# Patient Record
Sex: Female | Born: 1995 | Race: Black or African American | Hispanic: No | Marital: Single | Smoking: Former smoker
Health system: Southern US, Community
[De-identification: ages and names within clinical notes are randomized; demographics above are authoritative.]

## PROBLEM LIST (undated history)

## (undated) DIAGNOSIS — F419 Anxiety disorder, unspecified: Secondary | ICD-10-CM

## (undated) HISTORY — DX: Anxiety disorder, unspecified: F41.9

---
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
## 2017-05-07 ENCOUNTER — Emergency Department (HOSPITAL_COMMUNITY)
Admission: EM | Admit: 2017-05-07 | Discharge: 2017-05-07 | Disposition: A | Discharge status: Home / Self Care | Payer: Self-pay | Attending: Emergency Medicine | Admitting: Emergency Medicine

## 2017-05-07 ENCOUNTER — Encounter (HOSPITAL_COMMUNITY): Payer: Self-pay | Admitting: Pharmacy Technician

## 2017-05-07 ENCOUNTER — Other Ambulatory Visit: Payer: Self-pay

## 2017-05-07 DIAGNOSIS — J111 Influenza due to unidentified influenza virus with other respiratory manifestations: Secondary | ICD-10-CM | POA: Insufficient documentation

## 2017-05-07 DIAGNOSIS — R6889 Other general symptoms and signs: Secondary | ICD-10-CM

## 2017-05-07 DIAGNOSIS — F17210 Nicotine dependence, cigarettes, uncomplicated: Secondary | ICD-10-CM | POA: Insufficient documentation

## 2017-05-07 LAB — RAPID STREP SCREEN (MED CTR MEBANE ONLY): STREPTOCOCCUS, GROUP A SCREEN (DIRECT): NEGATIVE

## 2017-05-07 MED ORDER — ACETAMINOPHEN 500 MG PO TABS
1000.0000 mg | ORAL_TABLET | Freq: Once | ORAL | Status: AC
  Administered 2017-05-07: 1000 mg via ORAL
  Filled 2017-05-07: qty 2

## 2017-05-07 MED ORDER — GUAIFENESIN-CODEINE 100-10 MG/5ML PO SYRP: 5.0000 mL | ORAL_SOLUTION | Freq: Three times a day (TID) | ORAL | 0 refills | Status: DC | PRN

## 2017-05-07 NOTE — Discharge Instructions (Signed)
You do not have strep throat. Your symptoms are consistent with the flu.   I have written you a prescription for cough syrup. It has codeine in it so it may make you drowsy. Please do not drive, work or drink alcohol while taking it.   Drink plenty of fluids and get rest over the next few days.   Take ibuprofen as needed for body aches and pains and tylenol for fever.   I have also listed the information to [facility] below. They are located across the street and accept patients who do not have insurance.   Return to the ER if you have cough with trouble breathing, vomiting that will not stop or your symptoms worsen or become concerning in any way.

## 2017-05-07 NOTE — ED Provider Notes (Signed)
[facility] EMER[facility]ENCY [facility] Provider Note   CSN: [id number] Arrival date & time: 05/07/17  0947     History   Chief Complaint Chief Complaint  Patient presents with  . Influenza    HPI Victoria Bryan is a 22 y.o. female.  HPI   Victoria Bryan is a 22yo female with no significant past medical history who presents to the emergency department for evaluation of cough, sore throat, headache, congestion and body aches.  Patient states her symptoms developed 2 days ago.  Her cough is productive of a white phlegm.  Denies associated shortness of breath or chest pain.  Sore throat is 6/10 in severity and described as "scratchy." Worsened with coughing.  States that she has had chills at home, but has not measured her temperature.  Reports that she has been taking over-the-counter Mucinex and ibuprofen for her symptoms with mild relief.  States that she has a sister at home with similar symptoms and also several coworkers who are sick with similar.  She did not get her flu shot this year.  States that she is otherwise been able to eat and drink normally. Denies dysphagia, abdominal pain, n/v, diarrhea, dysuria, ear pain.   History reviewed. No pertinent past medical history.  There are no active problems to display for this patient.   History reviewed. No pertinent surgical history.  OB History    No data available       Home Medications    Prior to Admission medications   Medication Sig Start Date End Date Taking? Authorizing Provider  guaiFENesin (MUCINEX) 600 MG 12 hr tablet Take 1,200 mg by mouth 2 (two) times daily.   Yes [provider]  ibuprofen (ADVIL,MOTRIN) 200 MG tablet Take 400 mg by mouth every 6 (six) hours as needed for fever or mild pain.   Yes [provider]    Family History No family history on file.  Social History Social History   Tobacco Use  . Smoking status: Current Every Day Smoker    Packs/day: 0.25   Types: Cigarettes  Substance Use Topics  . Alcohol use: No  . Drug use: No     Allergies   Patient has no known allergies.   Review of Systems Review of Systems  Constitutional: Positive for chills and fatigue. Negative for fever.  HENT: Positive for congestion, rhinorrhea and sore throat. Negative for ear pain and trouble swallowing.   Respiratory: Positive for cough. Negative for shortness of breath and wheezing.   Cardiovascular: Negative for chest pain.  Gastrointestinal: Negative for abdominal pain, diarrhea, nausea and vomiting.  Musculoskeletal: Positive for myalgias (total body).  Neurological: Positive for headaches.     Physical Exam Updated Vital Signs BP 115/78 (BP Location: Right Arm)   Pulse (!) 105   Temp 99.1 F (37.3 C) (Oral)   Resp 18   Ht 5\' 4"  (1.626 m)   Wt 62.1 kg (137 lb)   LMP 04/14/2017   SpO2 97%   BMI 23.52 kg/m   Physical Exam  Constitutional: She is oriented to person, place, and time. She appears well-developed and well-nourished. No distress.  HENT:  Head: Normocephalic and atraumatic.  Clear rhinorrhea in bilateral nares. Posterior oropharynx erythematous.  No tonsillar swelling or exudate. Mucous membranes moist. Airway patent. Able to handle oral secretions.   Eyes: Conjunctivae are normal. Pupils are equal, round, and reactive to light. Right eye exhibits no discharge. Left eye exhibits no discharge.  Neck: Normal range of  motion. Neck supple.  Cardiovascular: Normal rate, regular rhythm and intact distal pulses. Exam reveals no friction rub.  No murmur heard. Pulmonary/Chest: Effort normal and breath sounds normal. No stridor. No respiratory distress. She has no wheezes. She has no rales.  Abdominal: Soft. Bowel sounds are normal. There is no tenderness. There is no guarding.  Lymphadenopathy:    She has no cervical adenopathy.  Neurological: She is alert and oriented to person, place, and time. Coordination normal.  Skin: Skin  is warm and dry. Capillary refill takes less than 2 seconds. She is not diaphoretic.  Psychiatric: She has a normal mood and affect. Her behavior is normal.  Nursing note and vitals reviewed.    ED Treatments / Results  Labs (all labs ordered are listed, but only abnormal results are displayed) Labs Reviewed  RAPID STREP SCREEN (NOT AT [facility])  CULTURE, GROUP A STREP [facility])    EKG  EKG Interpretation None       Radiology No results found.  Procedures Procedures (including critical care time)  Medications Ordered in ED Medications  acetaminophen (TYLENOL) tablet 1,000 mg (not administered)     Initial Impression / Assessment and Plan / ED Course  I have reviewed the triage vital signs and the nursing notes.  Pertinent labs & imaging results that were available during my care of the patient were reviewed by me and considered in my medical decision making (see chart for details).    Patient with symptoms consistent with influenza.  Rapid strep screen negative. Vitals are stable, low-grade fever.  No signs of dehydration, tolerating PO's.  Lungs are clear. Due to patient's presentation and physical exam a chest x-ray was not ordered bc likely diagnosis of flu.  Discussed the cost versus benefit of Tamiflu treatment with the patient.  The patient understands that symptoms are greater than the recommended window of treatment.  Patient will be discharged with instructions to orally hydrate, rest, and use over-the-counter medications such as anti-inflammatories ibuprofen and Aleve for muscle aches and Tylenol for fever.  Patient will also be given a cough suppressant.    Final Clinical Impressions(s) / ED Diagnoses   Final diagnoses:  Flu-like symptoms    ED Discharge Orders        Ordered    guaiFENesin-codeine [facility] AC) 100-10 MG/5ML syrup  3 times daily PRN     05/07/17 1216       Kellie Shropshire, PA-C 05/07/17 1216    Alvira Monday, MD 05/07/17  [phone]

## 2017-05-07 NOTE — ED Triage Notes (Signed)
Pt arrives POV with c/o cough, sore throat, headache, sore joints onset 2 days ago. Pt resp e/u, airway intact. Pt in NAD.

## 2017-05-09 LAB — CULTURE, GROUP A STREP (THRC)

## 2019-01-08 ENCOUNTER — Other Ambulatory Visit: Payer: Self-pay

## 2019-01-08 ENCOUNTER — Emergency Department (HOSPITAL_COMMUNITY): Payer: Self-pay

## 2019-01-08 ENCOUNTER — Emergency Department (HOSPITAL_COMMUNITY)
Admission: EM | Admit: 2019-01-08 | Discharge: 2019-01-09 | Disposition: A | Discharge status: Home / Self Care | Payer: Self-pay | Attending: Emergency Medicine | Admitting: Emergency Medicine

## 2019-01-08 ENCOUNTER — Encounter (HOSPITAL_COMMUNITY): Payer: Self-pay | Admitting: Emergency Medicine

## 2019-01-08 DIAGNOSIS — R0789 Other chest pain: Secondary | ICD-10-CM | POA: Insufficient documentation

## 2019-01-08 DIAGNOSIS — G8929 Other chronic pain: Secondary | ICD-10-CM

## 2019-01-08 DIAGNOSIS — R1032 Left lower quadrant pain: Secondary | ICD-10-CM | POA: Insufficient documentation

## 2019-01-08 DIAGNOSIS — F17210 Nicotine dependence, cigarettes, uncomplicated: Secondary | ICD-10-CM | POA: Insufficient documentation

## 2019-01-08 LAB — URINALYSIS, ROUTINE W REFLEX MICROSCOPIC
Bacteria, UA: NONE SEEN
Bilirubin Urine: NEGATIVE
Glucose, UA: NEGATIVE mg/dL
Hgb urine dipstick: NEGATIVE
Ketones, ur: 5 mg/dL — AB
Nitrite: NEGATIVE
Protein, ur: NEGATIVE mg/dL
Specific Gravity, Urine: 1.028 (ref 1.005–1.030)
pH: 6 (ref 5.0–8.0)

## 2019-01-08 LAB — CBC
HCT: 38.3 % (ref 36.0–46.0)
Hemoglobin: 12.8 g/dL (ref 12.0–15.0)
MCH: 33.9 pg (ref 26.0–34.0)
MCHC: 33.4 g/dL (ref 30.0–36.0)
MCV: 101.3 fL — ABNORMAL HIGH (ref 80.0–100.0)
Platelets: 199 10*3/uL (ref 150–400)
RBC: 3.78 MIL/uL — ABNORMAL LOW (ref 3.87–5.11)
RDW: 12.7 % (ref 11.5–15.5)
WBC: 8.8 10*3/uL (ref 4.0–10.5)
nRBC: 0 % (ref 0.0–0.2)

## 2019-01-08 LAB — I-STAT BETA HCG BLOOD, ED (NOT ORDERABLE): I-stat hCG, quantitative: <5 m[IU]/mL (ref <5)

## 2019-01-08 NOTE — ED Triage Notes (Signed)
Patient c/o intermittent dull central chest pain since last night. C/o LLQ abdominal pain intermittently x6 months. Denies N/V/D, fever, cough, SOB.

## 2019-01-09 LAB — BASIC METABOLIC PANEL
Anion gap: 8 (ref 5–15)
BUN: 12 mg/dL (ref 6–20)
CO2: 28 mmol/L (ref 22–32)
Calcium: 9.4 mg/dL (ref 8.9–10.3)
Chloride: 105 mmol/L (ref 98–111)
Creatinine, Ser: 0.84 mg/dL (ref 0.44–1.00)
GFR calc Af Amer: >60 mL/min (ref >60)
GFR calc non Af Amer: >60 mL/min (ref >60)
Glucose, Bld: 117 mg/dL — ABNORMAL HIGH (ref 70–99)
Potassium: 3.9 mmol/L (ref 3.5–5.1)
Sodium: 141 mmol/L (ref 135–145)

## 2019-01-09 LAB — TROPONIN I (HIGH SENSITIVITY): Troponin I (High Sensitivity): <2 ng/L (ref <18)

## 2019-01-09 MED ORDER — IBUPROFEN 800 MG PO TABS
800.0000 mg | ORAL_TABLET | Freq: Once | ORAL | Status: AC
  Administered 2019-01-09: 800 mg via ORAL
  Filled 2019-01-09: qty 1

## 2019-01-09 MED ORDER — IBUPROFEN 800 MG PO TABS: 800.0000 mg | ORAL_TABLET | Freq: Three times a day (TID) | ORAL | 0 refills | Status: DC | PRN

## 2019-01-09 NOTE — ED Provider Notes (Signed)
TIME SEEN: 12:53 AM  CHIEF COMPLAINT: Chest pain, abdominal pain  HPI: Patient is a 23 year old female with no significant past medical history who presents to the emergency department with central chest pain that is worse with movement especially of her right arm for the past 2 days.  Pain was initially sharp in nature but now is dull.  Did not try any medications at home.  No shortness of breath, fever, cough, lower extremity swelling or pain.  No history of PE, DVT, exogenous estrogen use, recent fractures, surgery, trauma, hospitalization or prolonged travel. No calf tenderness.  Patient also reports left lower quadrant abdominal pain intermittently for 6 months.  No significant pain now.  No fevers, chills, vomiting, diarrhea, dysuria, hematuria, vaginal bleeding, discharge, bloody stools, melena.  Last menstrual period was 12/19/2018.  No previous abdominal surgery.  ROS: See HPI Constitutional: no fever  Eyes: no drainage  ENT: no runny nose   Cardiovascular:   chest pain  Resp: no SOB  GI: no vomiting GU: no dysuria Integumentary: no rash  Allergy: no hives  Musculoskeletal: no leg swelling  Neurological: no slurred speech ROS otherwise negative  PAST MEDICAL HISTORY/PAST SURGICAL HISTORY:  History reviewed. No pertinent past medical history.  MEDICATIONS:  Prior to Admission medications   Medication Sig Start Date End Date Taking? Authorizing Provider  ibuprofen (ADVIL) 800 MG tablet Take 1 tablet (800 mg total) by mouth every 8 (eight) hours as needed for mild pain. 01/09/19   Margee Trentham, Delice Bison, DO    ALLERGIES:  No Known Allergies  SOCIAL HISTORY:  Social History   Tobacco Use  . Smoking status: Current Every Day Smoker    Packs/day: 0.25    Types: Cigarettes  Substance Use Topics  . Alcohol use: No    FAMILY HISTORY: No family history on file.  EXAM: BP (!) 148/93 (BP Location: Right Arm)   Pulse (!) 103   Temp 98.7 F (37.1 C) (Oral)   Resp 16   Ht 5\' 4"   (1.626 m)   Wt 72.1 kg   LMP 12/19/2018   SpO2 100%   BMI 27.29 kg/m  CONSTITUTIONAL: Alert and oriented and responds appropriately to questions. Well-appearing; well-nourished HEAD: Normocephalic EYES: Conjunctivae clear, pupils appear equal, EOMI ENT: normal nose; moist mucous membranes NECK: Supple, no meningismus, no nuchal rigidity, no LAD  CARD: RRR; S1 and S2 appreciated; no murmurs, no clicks, no rubs, no gallops CHEST:  Chest wall is mildly tender to palpation.  No crepitus, ecchymosis, erythema, warmth, rash or other lesions present.   RESP: Normal chest excursion without splinting or tachypnea; breath sounds clear and equal bilaterally; no wheezes, no rhonchi, no rales, no hypoxia or respiratory distress, speaking full sentences ABD/GI: Normal bowel sounds; non-distended; soft, non-tender, no rebound, no guarding, no peritoneal signs, no hepatosplenomegaly BACK:  The back appears normal and is non-tender to palpation, there is no CVA tenderness EXT: Normal ROM in all joints; non-tender to palpation; no edema; normal capillary refill; no cyanosis, no calf tenderness or swelling    SKIN: Normal color for age and race; warm; no rash NEURO: Moves all extremities equally PSYCH: The patient's mood and manner are appropriate. Grooming and personal hygiene are appropriate.  MEDICAL DECISION MAKING: Patient here with atypical chest pain.  She has no risk factors for PE.  Pain reproducible with palpation and movement.  Suspect chest wall pain.  Troponin negative.  Chest x-ray clear.  EKG shows no ischemic abnormality.  Doubt ACS, dissection, PE.  Patient also having left lower quadrant abdominal pain for 6 months.  Abdominal exam benign.  Urine shows no sign of infection.  Recommended close outpatient follow-up for her chronic abdominal pain.  Given list of primary care physicians.  She is comfortable with this plan.  Do not feel she needs emergent imaging today.  Doubt perforation,  appendicitis, diverticulitis, colitis, ovarian torsion, ectopic pregnancy.  Recommended alternating Tylenol and Motrin for chest pain.  She is comfortable with this plan.  At this time, I do not feel there is any life-threatening condition present. I have reviewed and discussed all results (EKG, imaging, lab, urine as appropriate) and exam findings with patient/family. I have reviewed nursing notes and appropriate previous records.  I feel the patient is safe to be discharged home without further emergent workup and can continue workup as an outpatient as needed. Discussed usual and customary return precautions. Patient/family verbalize understanding and are comfortable with this plan.  Outpatient follow-up has been provided as needed. All questions have been answered.       EKG Interpretation  Date/Time:  Saturday January 08 2019 22:56:14 EDT Ventricular Rate:  86 PR Interval:    QRS Duration: 80 QT Interval:  333 QTC Calculation: 399 R Axis:   72 Text Interpretation:  Sinus rhythm Borderline abnrm T, anterolateral leads Baseline wander in lead(s) I III aVL V5 V6 No old tracing to compare Confirmed by Janeya Deyo, Baxter HireKristen [phone]) on 01/08/2019 11:44:56 PM         Marquist Binstock, Layla MawKristen N, DO 01/09/19 [id number]

## 2019-01-09 NOTE — Discharge Instructions (Signed)
You may alternate Tylenol 1000 mg every 6 hours as needed for pain and Ibuprofen 800 mg every 8 hours as needed for pain.  Please take Ibuprofen with food. ° °Steps to find a Primary Care Provider (PCP): ° °Call [phone] or [phone] to access "[facility] Find a Doctor Service." ° °2.  You may also go on the [facility] website at [email or web address] ° °3.  [facility] also frequently accepts new patients. ° °[facility]  °[address] °[place] [address] °[phone] ° °4.  There are also multiple [facility], [facility] and [facility] practices throughout the [place] that are frequently accepting new patients. You may find a clinic that is close to your home and contact them. ° °[facility] °[email or web address] °[phone] ° °[facility] °[email or web address] ° °[facility] °[email or web address] °[phone] ° °[facility] °[email or web address] °[phone] ° °5.  Local Health Departments also can provide primary care services. ° °[facility]  °[address] °[place] [address] °[phone] ° °[facility] °[address] °[place] [address] °[phone] ° °[facility] °[address]  °[place] [address] °[phone] ° ° °

## 2019-08-04 ENCOUNTER — Ambulatory Visit: Payer: Self-pay | Attending: Internal Medicine

## 2019-08-04 DIAGNOSIS — Z23 Encounter for immunization: Secondary | ICD-10-CM

## 2019-08-04 NOTE — Progress Notes (Signed)
   Covid-19 Vaccination Clinic  Name:  Victoria Bryan    MRN: [id number] DOB: 10-02-1995  08/04/2019  Victoria Bryan was observed post Covid-19 immunization for 15 minutes without incident. She was provided with Vaccine Information Sheet and instruction to access the V-Safe system.   Victoria Bryan was instructed to call 911 with any severe reactions post vaccine: Marland Kitchen Difficulty breathing  . Swelling of face and throat  . A fast heartbeat  . A bad rash all over body  . Dizziness and weakness   Immunizations Administered    Name Date Dose VIS Date Route   Pfizer COVID-19 Vaccine 08/04/2019 11:03 AM 0.3 mL 04/01/2019 Intramuscular   Manufacturer: [employer], [employer]   Lot: [id number]   NDC: [address]-2

## 2019-08-29 ENCOUNTER — Ambulatory Visit: Payer: Self-pay | Attending: Internal Medicine

## 2019-11-19 ENCOUNTER — Other Ambulatory Visit: Payer: Self-pay

## 2019-11-19 ENCOUNTER — Ambulatory Visit: Payer: Self-pay | Attending: Internal Medicine

## 2019-11-19 DIAGNOSIS — Z23 Encounter for immunization: Secondary | ICD-10-CM

## 2019-11-19 NOTE — Progress Notes (Signed)
   Covid-19 Vaccination Clinic  Name:  Krissa Utke    MRN: [id number] DOB: [birth year]  11/19/2019  Ms. Glynn was observed post Covid-19 immunization for 15 minutes without incident. She was provided with Vaccine Information Sheet and instruction to access the V-Safe system.   Ms. Botsford was instructed to call 911 with any severe reactions post vaccine: Marland Kitchen Difficulty breathing  . Swelling of face and throat  . A fast heartbeat  . A bad rash all over body  . Dizziness and weakness   Immunizations Administered    Name Date Dose VIS Date Route   Pfizer COVID-19 Vaccine 11/19/2019  8:12 AM 0.3 mL 06/15/2018 Intramuscular   Manufacturer: [employer], [employer]   Lot: [id number]   NDC: [address]-5

## 2020-08-23 ENCOUNTER — Ambulatory Visit
Admission: RE | Admit: 2020-08-23 | Discharge: 2020-08-23 | Disposition: A | Discharge status: Home / Self Care | Payer: No Typology Code available for payment source | Source: Ambulatory Visit | Attending: Emergency Medicine | Admitting: Emergency Medicine

## 2020-08-23 ENCOUNTER — Other Ambulatory Visit: Payer: Self-pay | Admitting: Emergency Medicine

## 2020-08-23 DIAGNOSIS — R609 Edema, unspecified: Secondary | ICD-10-CM

## 2020-08-23 DIAGNOSIS — M79605 Pain in left leg: Secondary | ICD-10-CM

## 2020-09-20 IMAGING — CR DG CHEST 2V
2 series · 2 of 2 positions shown · non-contrast
Comparison: July 03, 2008

CLINICAL DATA: Intermittent dull chest pain.

EXAM:
CHEST - 2 VIEW

[w chest pa]
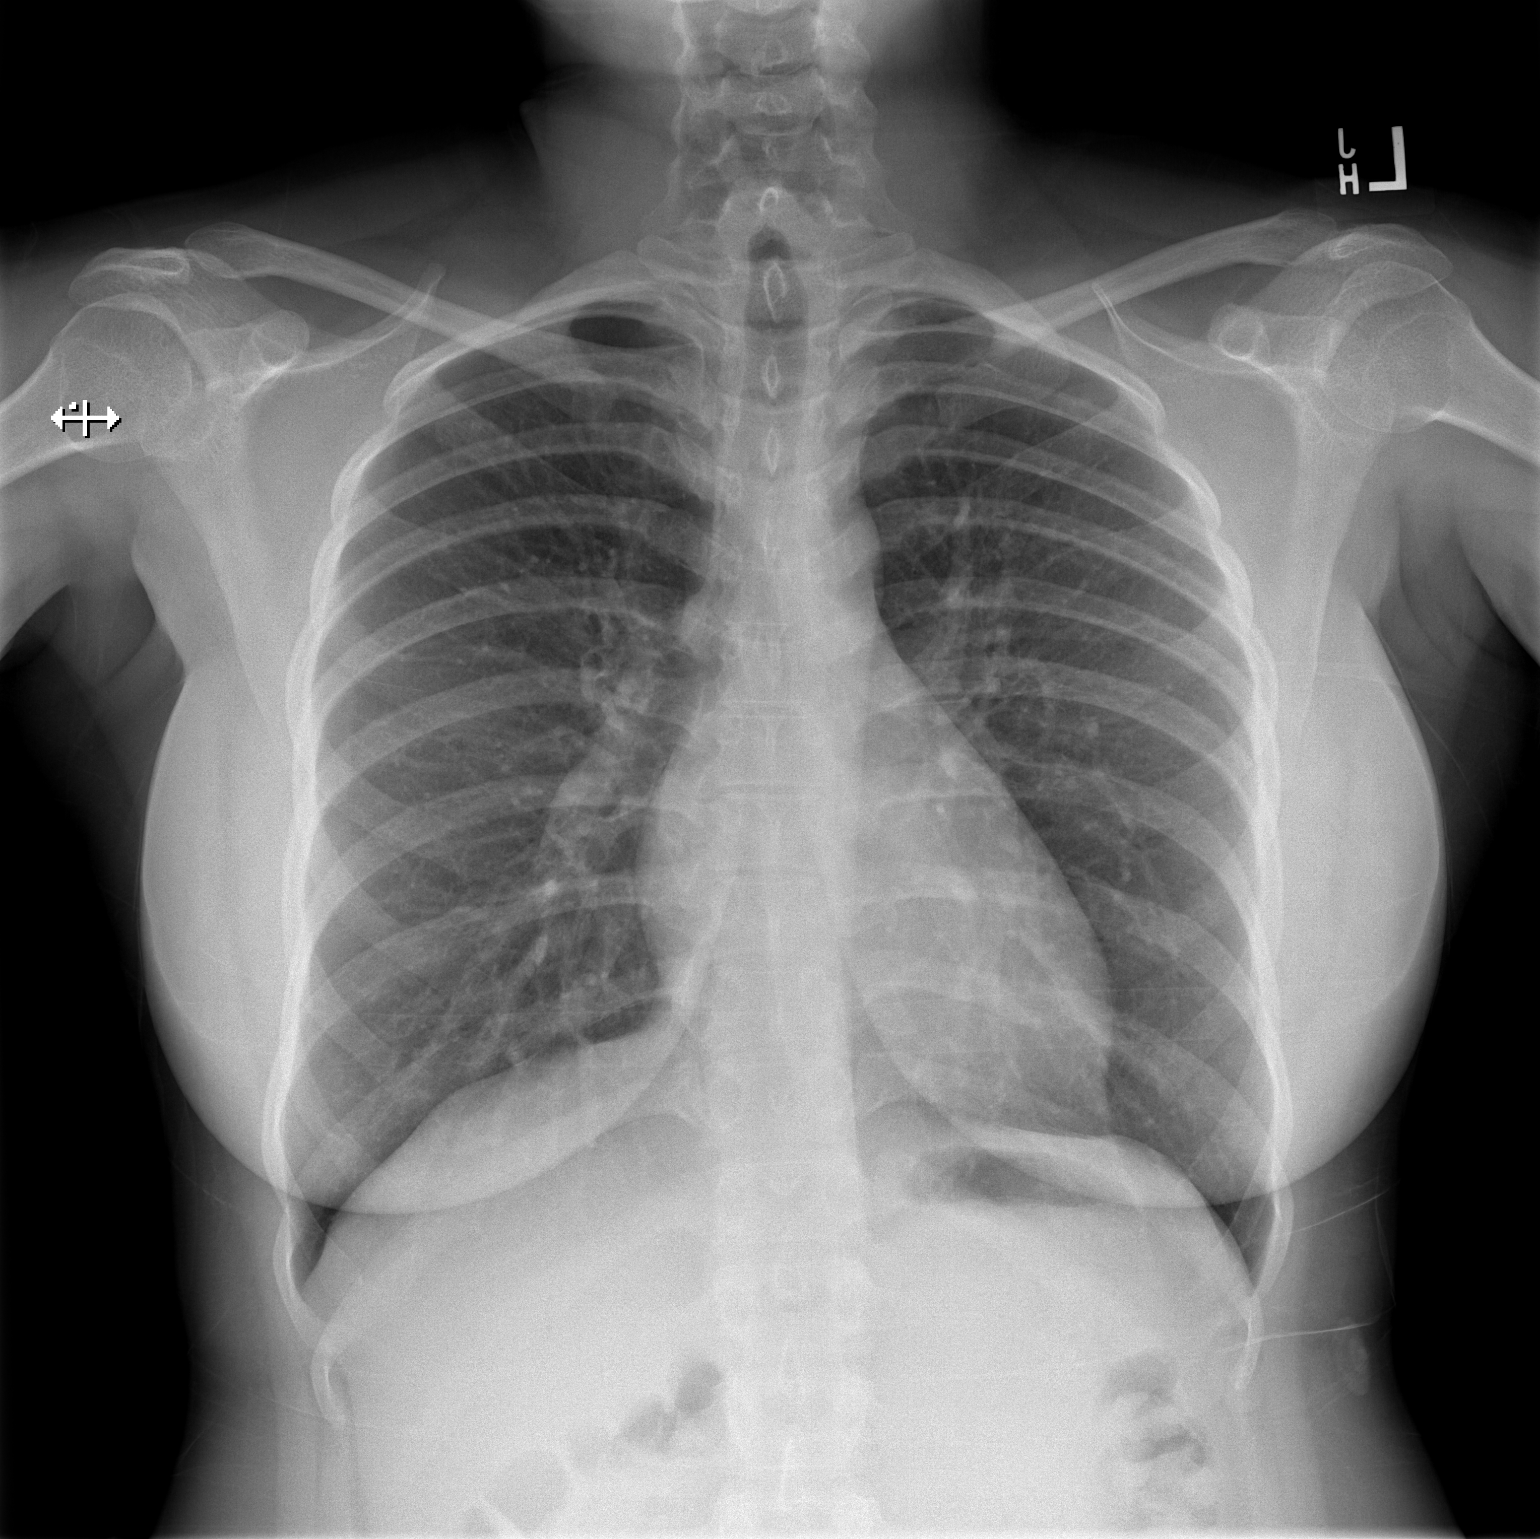

[w chest lat]
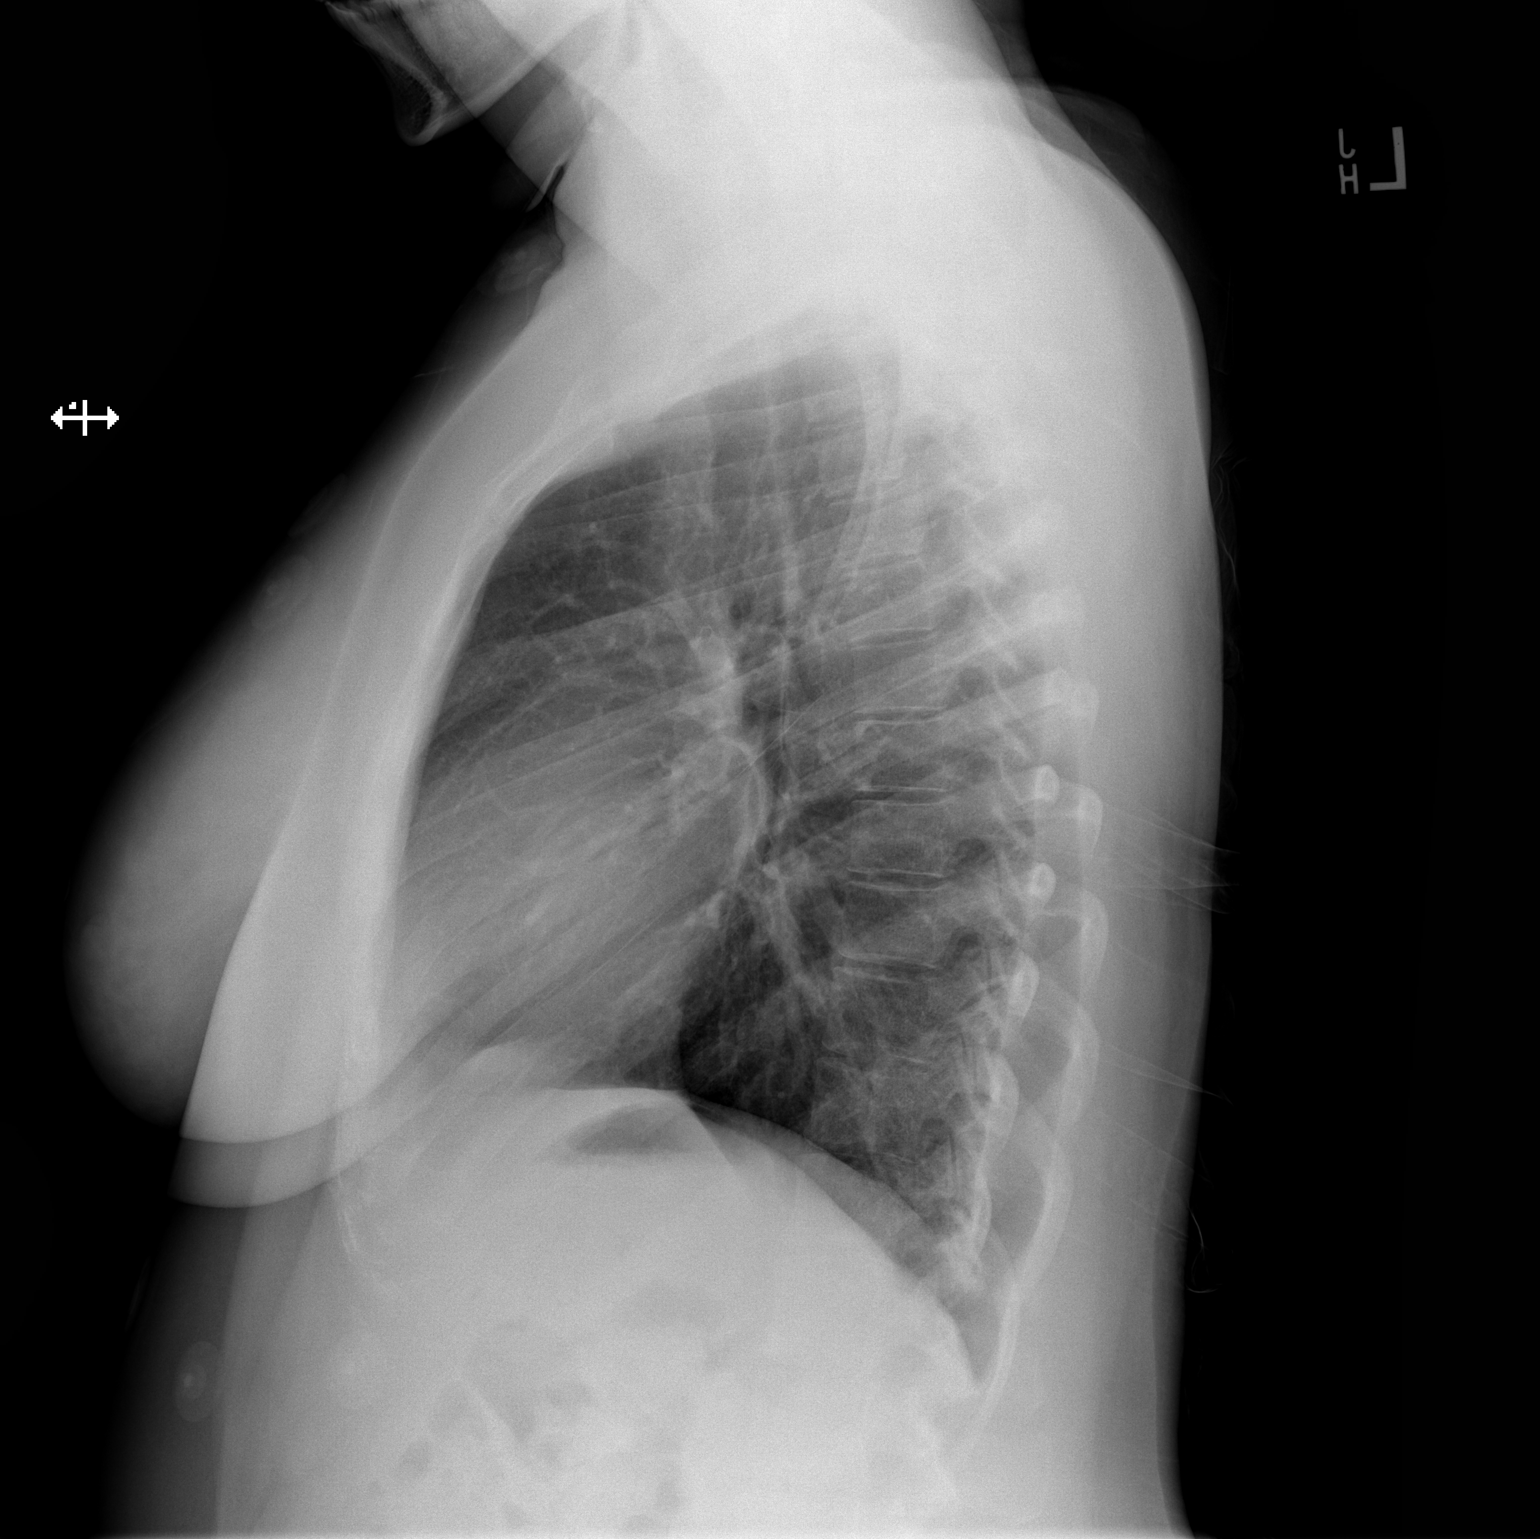

[2 of 2 positions shown; findings below may reference images not displayed]

FINDINGS: The heart size and mediastinal contours are within normal limits.
Both lungs are clear. The visualized skeletal structures are
unremarkable.
IMPRESSION: No active cardiopulmonary disease.

## 2020-11-22 ENCOUNTER — Other Ambulatory Visit: Payer: Self-pay

## 2020-11-22 ENCOUNTER — Encounter (HOSPITAL_COMMUNITY): Payer: Self-pay | Admitting: *Deleted

## 2020-11-22 ENCOUNTER — Emergency Department (HOSPITAL_COMMUNITY)
Admission: EM | Admit: 2020-11-22 | Discharge: 2020-11-22 | Disposition: A | Discharge status: Home / Self Care | Payer: Self-pay | Attending: Emergency Medicine | Admitting: Emergency Medicine

## 2020-11-22 DIAGNOSIS — Z87891 Personal history of nicotine dependence: Secondary | ICD-10-CM | POA: Insufficient documentation

## 2020-11-22 DIAGNOSIS — M62838 Other muscle spasm: Secondary | ICD-10-CM | POA: Insufficient documentation

## 2020-11-22 DIAGNOSIS — Z20822 Contact with and (suspected) exposure to covid-19: Secondary | ICD-10-CM | POA: Insufficient documentation

## 2020-11-22 DIAGNOSIS — R0981 Nasal congestion: Secondary | ICD-10-CM | POA: Insufficient documentation

## 2020-11-22 DIAGNOSIS — G43909 Migraine, unspecified, not intractable, without status migrainosus: Secondary | ICD-10-CM | POA: Insufficient documentation

## 2020-11-22 DIAGNOSIS — R519 Headache, unspecified: Secondary | ICD-10-CM | POA: Insufficient documentation

## 2020-11-22 LAB — RESP PANEL BY RT-PCR (FLU A&B, COVID) ARPGX2
Influenza A by PCR: NEGATIVE
Influenza B by PCR: NEGATIVE
SARS Coronavirus 2 by RT PCR: NEGATIVE

## 2020-11-22 MED ORDER — SALINE SPRAY 0.65 % NA SOLN: 1.0000 | NASAL | Status: DC | PRN

## 2020-11-22 MED ORDER — BUTALBITAL-APAP-CAFFEINE 50-325-40 MG PO TABS: 1.0000 | ORAL_TABLET | Freq: Four times a day (QID) | ORAL | 0 refills | Status: AC | PRN

## 2020-11-22 MED ORDER — DIPHENHYDRAMINE HCL 50 MG/ML IJ SOLN
50.0000 mg | Freq: Once | INTRAMUSCULAR | Status: DC
  Filled 2020-11-22: qty 1

## 2020-11-22 MED ORDER — METOCLOPRAMIDE HCL 5 MG/ML IJ SOLN
5.0000 mg | Freq: Once | INTRAMUSCULAR | Status: AC
  Administered 2020-11-22: 5 mg via INTRAVENOUS
  Filled 2020-11-22: qty 2

## 2020-11-22 MED ORDER — SODIUM CHLORIDE 0.9 % IV SOLN: 50.0000 mg | INTRAVENOUS | Status: DC | PRN

## 2020-11-22 MED ORDER — KETOROLAC TROMETHAMINE 15 MG/ML IJ SOLN
15.0000 mg | Freq: Once | INTRAMUSCULAR | Status: AC
  Administered 2020-11-22: 15 mg via INTRAVENOUS
  Filled 2020-11-22: qty 1

## 2020-11-22 MED ORDER — ACETAMINOPHEN 325 MG PO TABS
650.0000 mg | ORAL_TABLET | Freq: Once | ORAL | Status: AC
  Administered 2020-11-22: 650 mg via ORAL
  Filled 2020-11-22: qty 2

## 2020-11-22 MED ORDER — ORPHENADRINE CITRATE 30 MG/ML IJ SOLN
60.0000 mg | Freq: Once | INTRAMUSCULAR | Status: AC
  Administered 2020-11-22: 60 mg via INTRAMUSCULAR
  Filled 2020-11-22: qty 2

## 2020-11-22 MED ORDER — SODIUM CHLORIDE 0.9 % IV BOLUS
1000.0000 mL | Freq: Once | INTRAVENOUS | Status: AC
  Administered 2020-11-22: 1000 mL via INTRAVENOUS

## 2020-11-22 MED ORDER — LIDOCAINE 5 % EX PTCH
1.0000 | MEDICATED_PATCH | Freq: Once | CUTANEOUS | Status: DC
  Administered 2020-11-22: 1 via TRANSDERMAL
  Filled 2020-11-22: qty 1

## 2020-11-22 NOTE — ED Provider Notes (Addendum)
[facility]-EMERGENCY DEPT Provider Note   CSN: [id number] Arrival date & time: 11/22/20  3614     History Chief Complaint  Patient presents with   Headache    Victoria Bryan is a 25 y.o. female.  This is a 25 y/o female with history as below who presents for headache. Patient reports history of intermittent sinus headache in the past. Reports this headache began on Saturday, was gradual in onset and began after drinking ETOH. She went to bed after the gradual onset of the headache and when she woke up in the morning on Sunday the headache had persisted. Described as pulsatile, localized to left side of head/face/neck. Left sided muscle spasm sensation intermittently. Not a/w fevers, vision loss. She reports some mild intermittent nausea but she is tolerating PO without difficulty. Headache not described as worst of her life or sudden in onset. She has been taking motrin/tylenol intermittently with mild relief of symptoms. Last medication was yesterday evening.   The history is provided by the patient. No language interpreter was used.  Headache Pain location:  L temporal Quality:  Dull Severity currently:  5/10 Onset quality:  Gradual Timing:  Intermittent Progression:  Waxing and waning Associated symptoms: congestion   Associated symptoms: no abdominal pain, no back pain, no cough, no fever and no nausea       History reviewed. No pertinent past medical history.  There are no problems to display for this patient.   History reviewed. No pertinent surgical history.   OB History   No obstetric history on file.     History reviewed. No pertinent family history.  Social History   Tobacco Use   Smoking status: Former    Packs/day: 0.25    Types: Cigarettes   Smokeless tobacco: Never  Vaping Use   Vaping Use: Every day  Substance Use Topics   Alcohol use: Yes   Drug use: No    Home Medications Prior to Admission medications   Medication Sig  Start Date End Date Taking? Authorizing Provider  butalbital-acetaminophen-caffeine (FIORICET) [phone] MG tablet Take 1 tablet by mouth every 6 (six) hours as needed for headache. 11/22/20 11/22/21 Yes Tanda Rockers A, DO  sodium chloride (OCEAN) 0.65 % SOLN nasal spray Place 1 spray into both nostrils as needed for congestion. 11/22/20  Yes Tanda Rockers A, DO  ibuprofen (ADVIL) 800 MG tablet Take 1 tablet (800 mg total) by mouth every 8 (eight) hours as needed for mild pain. 01/09/19   Ward, Layla Maw, DO    Allergies    Patient has no known allergies.  Review of Systems   Review of Systems  Constitutional:  Negative for activity change and fever.  HENT:  Positive for congestion. Negative for facial swelling and trouble swallowing.   Eyes:  Negative for discharge and redness.  Respiratory:  Negative for cough and shortness of breath.   Cardiovascular:  Negative for chest pain and palpitations.  Gastrointestinal:  Negative for abdominal pain and nausea.  Genitourinary:  Negative for dysuria and flank pain.  Musculoskeletal:  Negative for back pain and gait problem.  Skin:  Negative for pallor and rash.  Neurological:  Positive for headaches. Negative for syncope.   Physical Exam Updated Vital Signs BP 107/62   Pulse 70   Temp 98.9 F (37.2 C)   Resp 18   Ht 5\' 4"  (1.626 m)   Wt 81.2 kg   SpO2 100%   BMI 30.73 kg/m   Physical Exam Constitutional:  General: She is not in acute distress.    Appearance: Normal appearance.  HENT:     Head: Normocephalic and atraumatic.     Comments: No meningismus     Right Ear: External ear normal.     Left Ear: External ear normal.     Nose: Nose normal.     Mouth/Throat:     Mouth: Mucous membranes are moist.  Eyes:     General: Vision grossly intact. Gaze aligned appropriately. No visual field deficit or scleral icterus.       Right eye: No discharge.        Left eye: No discharge.     Extraocular Movements: Extraocular movements  intact.     Right eye: Normal extraocular motion.     Left eye: Normal extraocular motion.     Pupils: Pupils are equal, round, and reactive to light.  Cardiovascular:     Rate and Rhythm: Normal rate and regular rhythm.     Pulses: Normal pulses.     Heart sounds: Normal heart sounds.  Pulmonary:     Effort: Pulmonary effort is normal. No respiratory distress.     Breath sounds: Normal breath sounds.  Abdominal:     General: Abdomen is flat.     Palpations: Abdomen is soft.     Tenderness: There is no abdominal tenderness.  Musculoskeletal:        General: Normal range of motion.     Cervical back: Normal range of motion and neck supple. No rigidity.     Right lower leg: No edema.     Left lower leg: No edema.  Skin:    General: Skin is warm and dry.     Capillary Refill: Capillary refill takes less than 2 seconds.  Neurological:     Mental Status: She is alert and oriented to person, place, and time.     GCS: GCS eye subscore is 4. GCS verbal subscore is 5. GCS motor subscore is 6.     Cranial Nerves: Cranial nerves are intact.     Sensory: Sensation is intact. No sensory deficit.     Motor: Motor function is intact. No weakness.     Coordination: Coordination is intact. Finger-Nose-Finger Test normal.     Gait: Gait is intact. Gait normal.  Psychiatric:        Mood and Affect: Mood normal.        Behavior: Behavior normal.    ED Results / Procedures / Treatments   Labs (all labs ordered are listed, but only abnormal results are displayed) Labs Reviewed  RESP PANEL BY RT-PCR (FLU A&B, COVID) [id number]    EKG None  Radiology No results found.  Procedures Procedures   Medications Ordered in ED Medications  lidocaine (LIDODERM) 5 % 1 patch (1 patch Transdermal Patch Applied 11/22/20 0846)  diphenhydrAMINE (BENADRYL) injection 50 mg (50 mg Intravenous Not Given 11/22/20 0845)  sodium chloride 0.9 % bolus 1,000 mL (0 mLs Intravenous Stopped 11/22/20 1002)  metoCLOPramide  (REGLAN) injection 5 mg (5 mg Intravenous Given 11/22/20 0844)  acetaminophen (TYLENOL) tablet 650 mg (650 mg Oral Given 11/22/20 0844)  ketorolac (TORADOL) 15 MG/ML injection 15 mg (15 mg Intravenous Given 11/22/20 1002)  orphenadrine (NORFLEX) injection 60 mg (60 mg Intramuscular Given 11/22/20 1004)    ED Course  I have reviewed the triage vital signs and the nursing notes.  Pertinent labs & imaging results that were available during my care of the patient were reviewed by me  and considered in my medical decision making (see chart for details).    MDM Rules/Calculators/A&P                          This is a 25 y/o female with history as above presenting for headache. She is non-toxic appearing, vital signs reviewed and are stable. Neurological exam is benign. Serious etiology considered.  Patient's headache not described as the worst headache of her life, it was gradual in onset, not a/w neurological changes, no facial droops, no fevers, no focal neurological deficit. No dizziness or weakness.  Patient with likely sinus headache vs migraine headache. Patient's symptoms have greatly improved following intervention in the ER. She is stable at this time. Discussed avoidance of known headache triggers and supportive care regarding future headaches. Discussed strict return precautions with the patient which she is able to understand. Recommend o/p neurology follow up as well. Advised patient to f/u with her PCP within the next 24-48 hours for ongoing evaluation. She is agreeable to plan and stable for discharge at this time.   PDMP reviewed with another physician prior to prescribing Fioricet  Final Clinical Impression(s) / ED Diagnoses Final diagnoses:  Nonintractable headache, unspecified chronicity pattern, unspecified headache type    Rx / DC Orders ED Discharge Orders          Ordered    butalbital-acetaminophen-caffeine (FIORICET) 50-325-40 MG tablet  Every 6 hours PRN        11/22/20  1108    sodium chloride (OCEAN) 0.65 % SOLN nasal spray  As needed        11/22/20 1109             Sloan Leiter, DO 11/22/20 1452    Sloan Leiter, DO 11/22/20 1455

## 2020-11-22 NOTE — ED Triage Notes (Signed)
Headache x 6 days, Describes as pressure. Nausea at intervals with it.

## 2022-05-06 IMAGING — US US EXTREM LOW VENOUS*L*
1 series · 14 of 24 positions shown · non-contrast
Comparison: None.

CLINICAL DATA: LEFT leg pain, edema, concern for deep venous
thrombosis.

EXAM:
LEFT LOWER EXTREMITY VENOUS DOPPLER ULTRASOUND
TECHNIQUE: Gray-scale sonography with compression, as well as color and duplex
ultrasound, were performed to evaluate the deep venous system(s)
from the level of the common femoral vein through the popliteal and
proximal calf veins.

[Series 1: us extrem low venous*left* · 0.06mm/px · 14 of 43 slices shown]
[im 1/43]
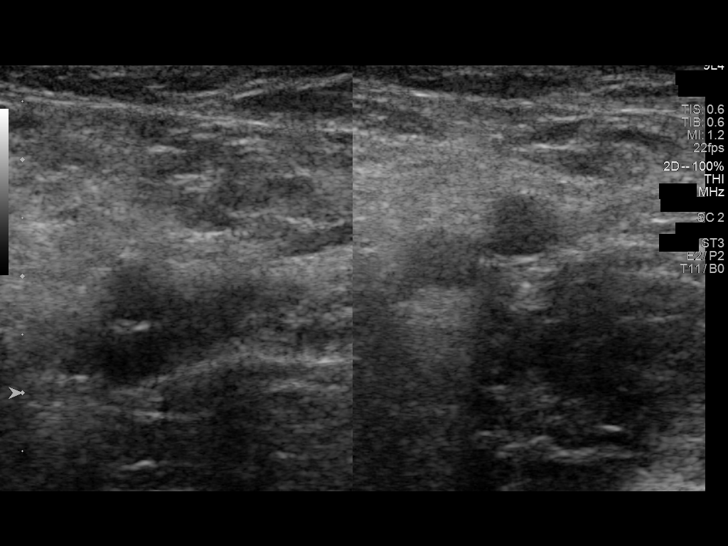
[im 4/43]
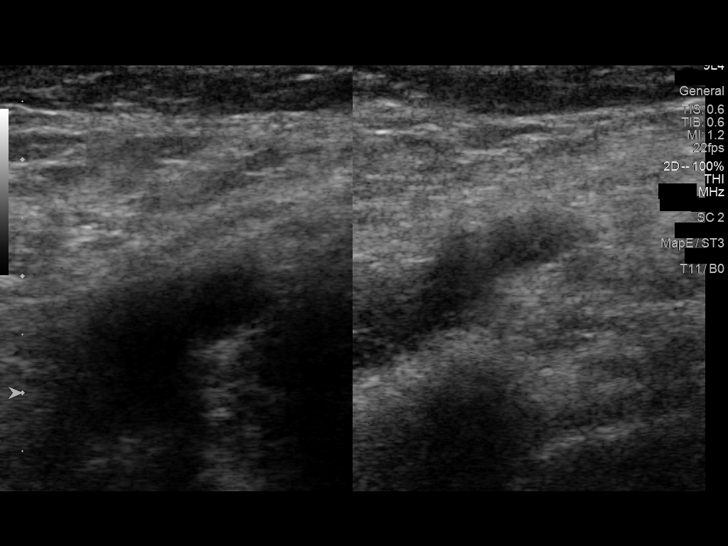
[im 8/43]
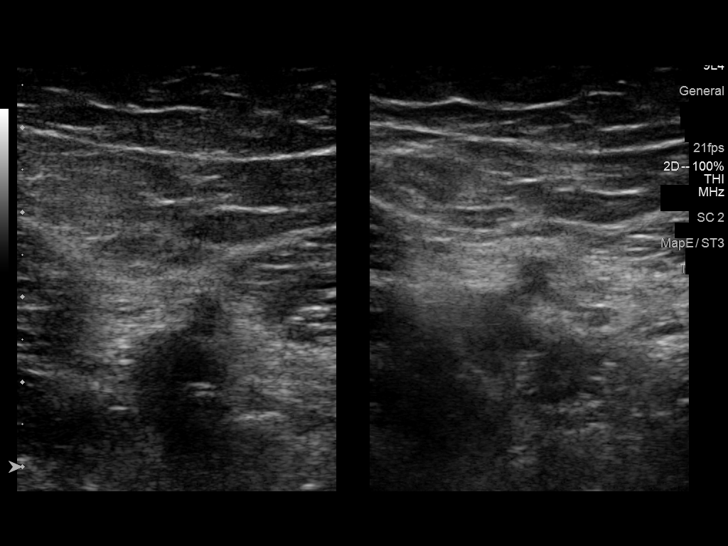
[im 11/43]
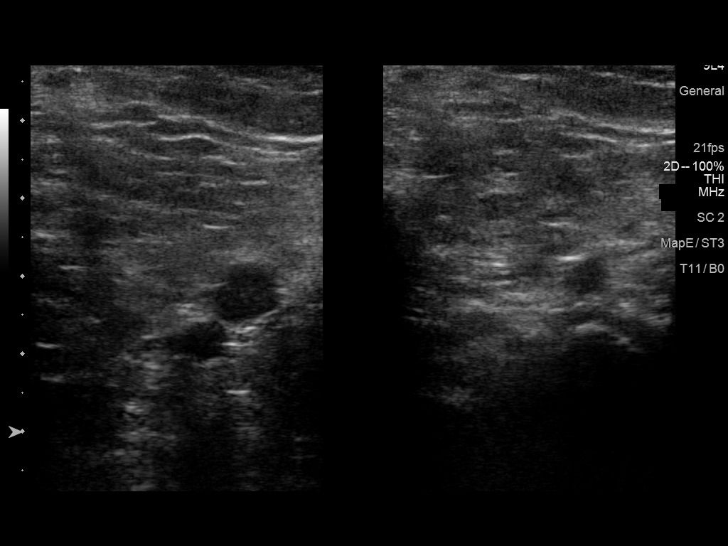
[im 13/43]
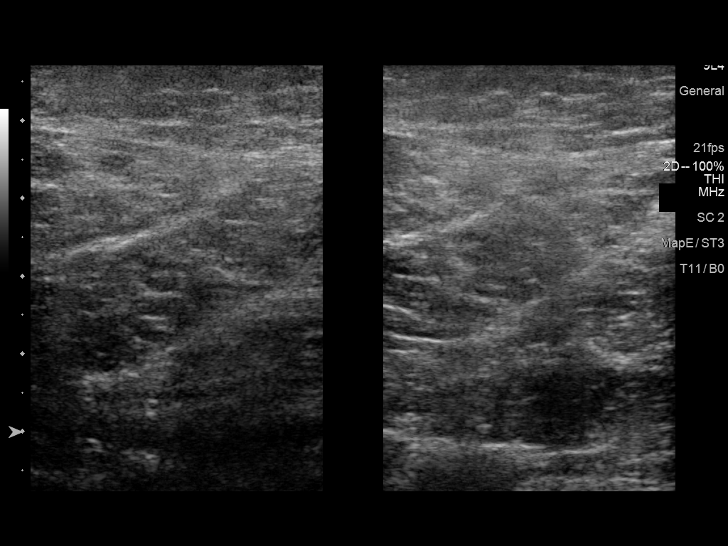
[im 17/43]
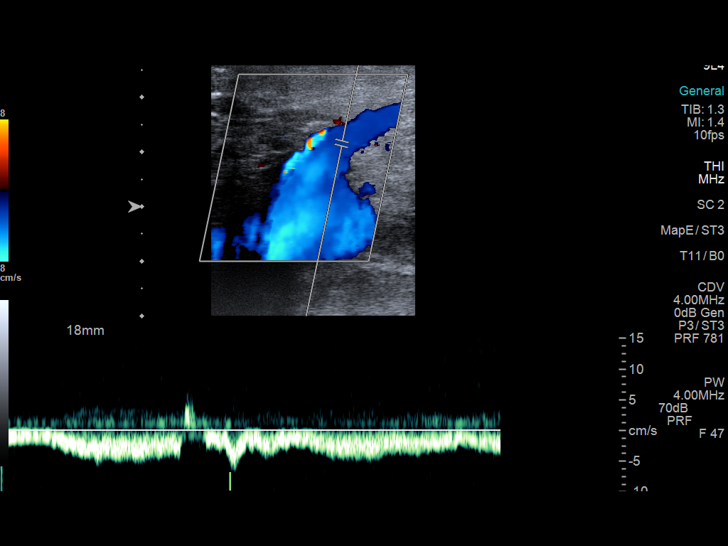
[im 21/43]
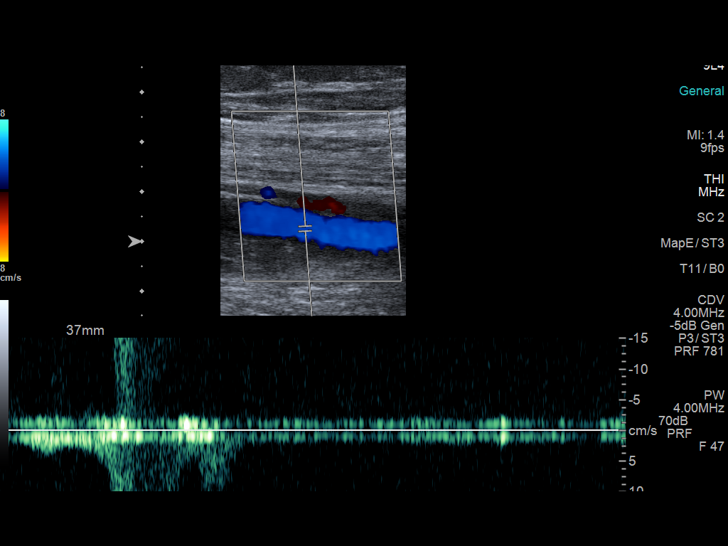
[im 22/43]
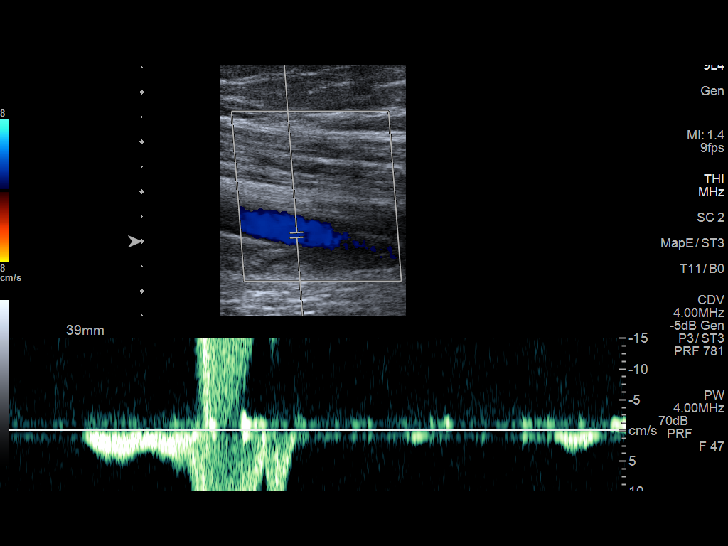
[im 26/43]
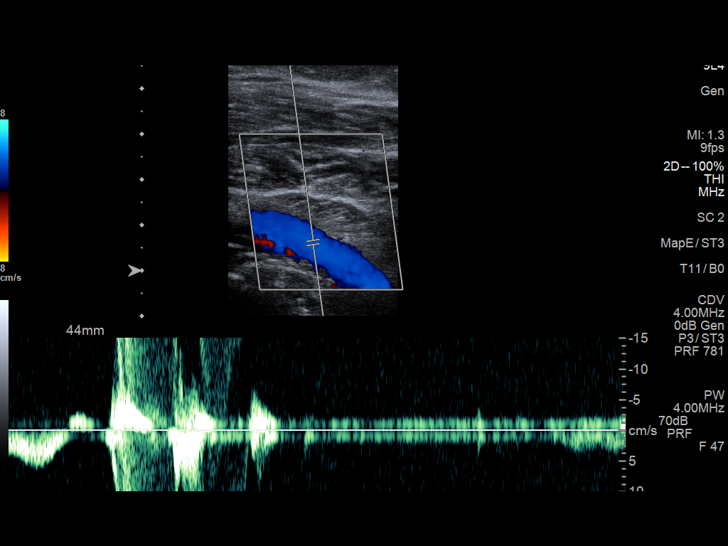
[im 30/43]
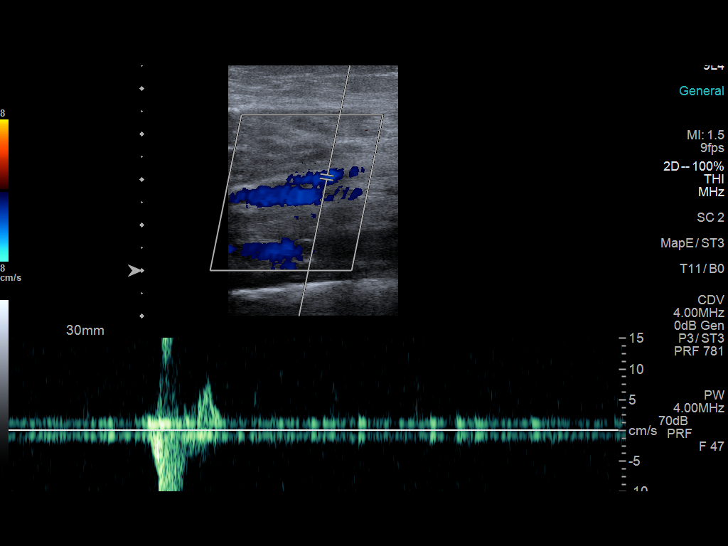
[im 33/43]
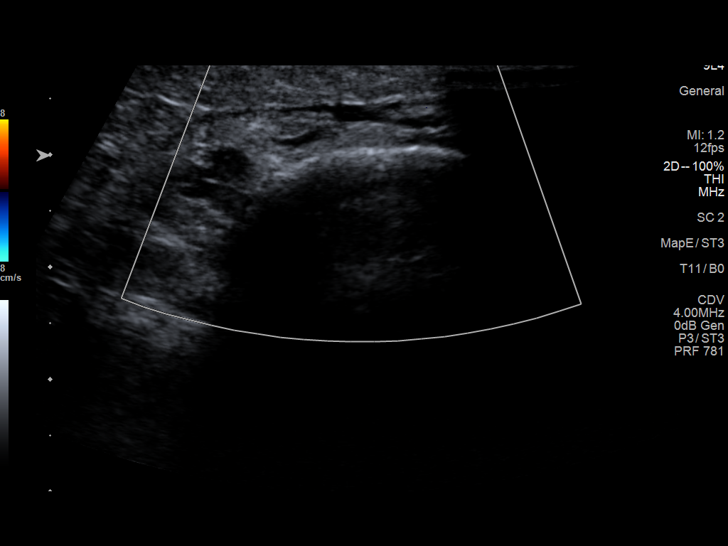
[im 35/43]
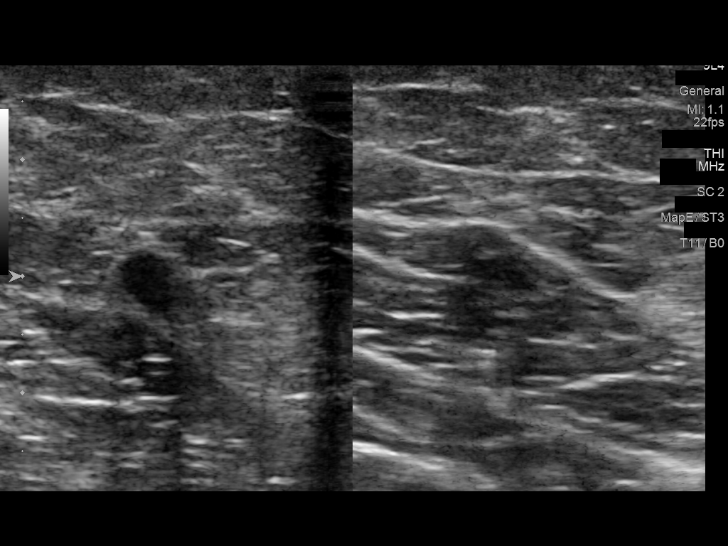
[im 39/43]
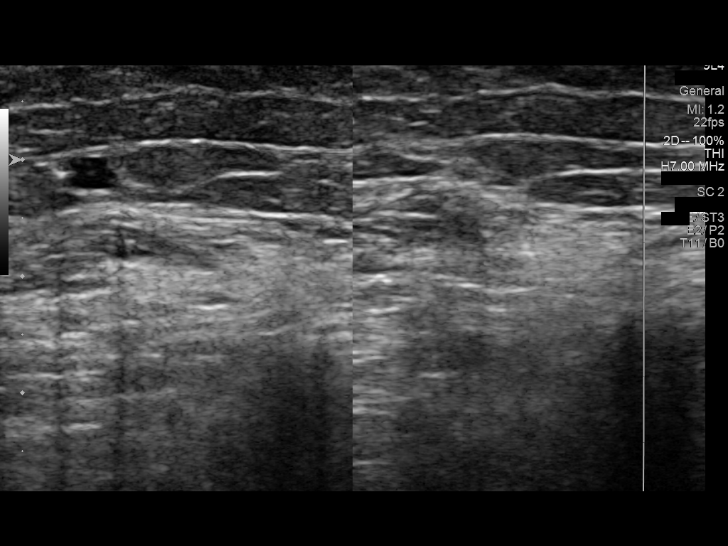
[im 43/43]
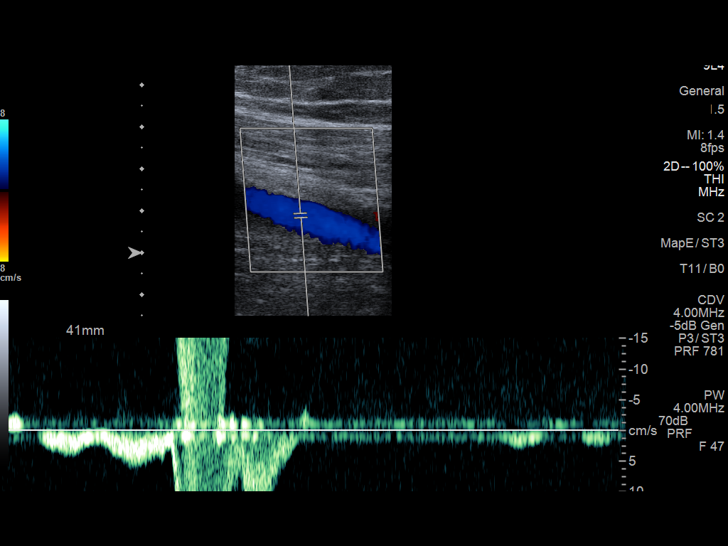

[14 of 24 positions shown; findings below may reference images not displayed]

FINDINGS: VENOUS

Normal compressibility of the common femoral, superficial femoral,
and popliteal veins, as well as the visualized calf veins.
Visualized portions of profunda femoral vein and great saphenous
vein unremarkable. No filling defects to suggest DVT on grayscale or
color Doppler imaging. Doppler waveforms show normal direction of
venous flow, normal respiratory plasticity and response to
augmentation.

Limited views of the contralateral common femoral vein are
unremarkable.

OTHER

None.

Limitations: none
IMPRESSION: No evidence of deep venous thrombosis.

## 2022-09-08 ENCOUNTER — Ambulatory Visit: Payer: 59 | Admitting: Family Medicine

## 2022-09-26 ENCOUNTER — Encounter: Payer: Self-pay | Admitting: Family Medicine

## 2022-09-26 ENCOUNTER — Ambulatory Visit: Payer: 59 | Admitting: Family Medicine

## 2022-09-26 VITALS — BP 116/82 | HR 78 | Temp 97.9°F | Ht 62.75 in | Wt 181.2 lb

## 2022-09-26 DIAGNOSIS — F10180 Alcohol abuse with alcohol-induced anxiety disorder: Secondary | ICD-10-CM | POA: Diagnosis not present

## 2022-09-26 DIAGNOSIS — F17200 Nicotine dependence, unspecified, uncomplicated: Secondary | ICD-10-CM | POA: Insufficient documentation

## 2022-09-26 DIAGNOSIS — A599 Trichomoniasis, unspecified: Secondary | ICD-10-CM

## 2022-09-26 DIAGNOSIS — F1020 Alcohol dependence, uncomplicated: Secondary | ICD-10-CM | POA: Insufficient documentation

## 2022-09-26 DIAGNOSIS — F10130 Alcohol abuse with withdrawal, uncomplicated: Secondary | ICD-10-CM | POA: Insufficient documentation

## 2022-09-26 LAB — URINALYSIS, ROUTINE W REFLEX MICROSCOPIC
Bilirubin Urine: NEGATIVE
Hgb urine dipstick: NEGATIVE
Ketones, ur: NEGATIVE
Nitrite: NEGATIVE
Specific Gravity, Urine: 1.015 (ref 1.000–1.030)
Total Protein, Urine: NEGATIVE
Urine Glucose: NEGATIVE
Urobilinogen, UA: 0.2 (ref 0.0–1.0)
pH: 6.5 (ref 5.0–8.0)

## 2022-09-26 LAB — CBC WITH DIFFERENTIAL/PLATELET
Basophils Absolute: 53 cells/uL (ref 0–200)
HCT: 34.6 % — ABNORMAL LOW (ref 35.0–45.0)
Hemoglobin: 11.3 g/dL — ABNORMAL LOW (ref 11.7–15.5)
Lymphs Abs: 3247 cells/uL (ref 850–3900)
MPV: 12.1 fL (ref 7.5–12.5)
Neutrophils Relative %: 40.7 %
Total Lymphocyte: 49.2 %

## 2022-09-26 LAB — MICROALBUMIN / CREATININE URINE RATIO
Creatinine,U: 92.5 mg/dL
Microalb Creat Ratio: 0.8 mg/g (ref 0.0–30.0)
Microalb, Ur: <0.7 mg/dL (ref 0.0–1.9)

## 2022-09-26 LAB — APTT: aPTT: 33.1 s (ref 25.4–36.8)

## 2022-09-26 LAB — PROTIME-INR
INR: 1.2 ratio — ABNORMAL HIGH (ref 0.8–1.0)
Prothrombin Time: 12.7 s (ref 9.6–13.1)

## 2022-09-26 MED ORDER — METRONIDAZOLE 500 MG PO TABS
500.0000 mg | ORAL_TABLET | Freq: Two times a day (BID) | ORAL | 0 refills | Status: AC
Start: 2022-09-26 — End: 2022-10-03

## 2022-09-26 MED ORDER — NALTREXONE HCL 50 MG PO TABS
ORAL_TABLET | ORAL | 0 refills | Status: AC
Start: 2022-09-26 — End: 2022-10-26

## 2022-09-26 NOTE — Patient Instructions (Signed)
Obtain baseline blood tests, including Liver Function Tests (LFTs) and to assess overall health. Maintain sobriety and monitor for any signs of withdrawal; consider scheduling follow-ups to track progress. Avoid alcohol consumption and stay aware of how it affects your health, particularly liver health. Start naltrexone to help manage alcohol cravings and help prevent relapse;

## 2022-09-26 NOTE — Assessment & Plan Note (Signed)
Patient has a history of chronic alcohol abuse, with recent cessation for 40 days. Patient is concerned about liver health and has experienced anxiety which has improved since cessation. Patient has supportive family history of alcohol abuse in multiple members and expressed the desire for future sobriety maintenance. No evidence of acute withdrawal. Plan:  Comprehensive Medication: Initiate Naltrexone 25 mg for one week, then increase to 50 mg as maintenance. Follow-Up: Recheck liver function in one month to monitor any adverse effects of Naltrexone. Psychosocial Support: Discuss potential counseling options, consider alternative forms of support if AA is not preferred. Lifestyle Recommendations: Encourage the adoption of hobbies or activities to replace alcohol use, continued avoidance of social settings that may trigger

## 2022-09-26 NOTE — Addendum Note (Signed)
Addended by: Garnette Gunner on: 09/26/2022 04:31 PM   Modules accepted: Orders

## 2022-09-26 NOTE — Addendum Note (Signed)
Addended by: Lindley Magnus L on: 09/26/2022 03:44 PM   Modules accepted: Orders

## 2022-09-26 NOTE — Progress Notes (Signed)
Assessment/Plan:   Problem List Items Addressed This Visit       Other   Tobacco use disorder   Alcohol abuse with withdrawal, uncomplicated (HCC) - Primary     Patient has a history of chronic alcohol abuse, with recent cessation for 40 days. Patient is concerned about liver health and has experienced anxiety which has improved since cessation. Patient has supportive family history of alcohol abuse in multiple members and expressed the desire for future sobriety maintenance. No evidence of acute withdrawal. Plan:  Comprehensive Medication: Initiate Naltrexone 25 mg for one week, then increase to 50 mg as maintenance. Follow-Up: Recheck liver function in one month to monitor any adverse effects of Naltrexone. Psychosocial Support: Discuss potential counseling options, consider alternative forms of support if AA is not preferred. Lifestyle Recommendations: Encourage the adoption of hobbies or activities to replace alcohol use, continued avoidance of social settings that may trigger      Relevant Medications   naltrexone (DEPADE) 50 MG tablet    Medications Discontinued During This Encounter  Medication Reason   sodium chloride (OCEAN) 0.65 % SOLN nasal spray    ibuprofen (ADVIL) 800 MG tablet     Return in about 4 weeks (around 10/24/2022) for alcohol use.    Subjective:   Encounter date: 09/26/2022  Victoria Bryan is a 27 y.o. female who has Migraine; Tobacco use disorder; and Alcohol abuse with withdrawal, uncomplicated (HCC) on their problem list..   She  has a past medical history of Anxiety (04/2018)..   CHIEF COMPLAINT: Establish care, migraines, alcohol use concerns, general health maintenance.  HISTORY OF PRESENT ILLNESS:  Migraines. Patient reports a history of migraines and has used Fioricet in the past.   Alcohol use. Patient reported a history of alcohol abuse, drinking 6-7 days per week since 2021, with variations in quantity from a few glasses of wine to a  fifth of liquor. Patient recently stopped drinking and has been sober for about 40 days. Initially experienced clay-colored stools but noted improvement over time. Patient expressed concerns about liver health and requested liver function tests.  General health maintenance. Patient requested baseline labs since she had not seen a primary care provider in a while.  Patient had a recent UA showing protein in urine at an urgent care visit and inquires about assessment.  Review of Systems  Constitutional:  Negative for chills, diaphoresis, fever, malaise/fatigue and weight loss.  HENT:  Negative for congestion, ear discharge, ear pain and hearing loss.   Eyes:  Negative for blurred vision, double vision, photophobia, pain, discharge and redness.  Respiratory:  Negative for cough, sputum production, shortness of breath and wheezing.   Cardiovascular:  Negative for chest pain and palpitations.  Gastrointestinal:  Negative for abdominal pain, blood in stool, constipation, diarrhea, heartburn, melena, nausea and vomiting.       Initial clay colored stools, but has resolved  Genitourinary:  Negative for dysuria, flank pain, frequency, hematuria and urgency.  Musculoskeletal:  Negative for myalgias.  Skin:  Negative for itching and rash.  Neurological:  Negative for dizziness, tingling, tremors, speech change, seizures, loss of consciousness and weakness.  Psychiatric/Behavioral:  Negative for depression, hallucinations, memory loss, substance abuse and suicidal ideas. The patient is nervous/anxious (Worse since quitting alcohol). The patient does not have insomnia.   All other systems reviewed and are negative.       09/26/2022   10:23 AM  Depression screen PHQ 2/9  Decreased Interest 0  Down, Depressed, Hopeless 1  PHQ - 2 Score 1  Altered sleeping 1  Tired, decreased energy 1  Change in appetite 2  Feeling bad or failure about yourself  0  Trouble concentrating 0  Moving slowly or  fidgety/restless 0  Suicidal thoughts 0  PHQ-9 Score 5  Difficult doing work/chores Somewhat difficult      09/26/2022   10:23 AM  GAD 7 : Generalized Anxiety Score  Nervous, Anxious, on Edge 1  Control/stop worrying 0  Worry too much - different things 0  Trouble relaxing 0  Restless 0  Easily annoyed or irritable 1  Afraid - awful might happen 0  Total GAD 7 Score 2  Anxiety Difficulty Somewhat difficult     History reviewed. No pertinent surgical history.  Outpatient Medications Prior to Visit  Medication Sig Dispense Refill   butalbital-acetaminophen-caffeine (FIORICET) 50-325-40 MG tablet Take by mouth 2 (two) times daily as needed for headache.     ibuprofen (ADVIL) 800 MG tablet Take 1 tablet (800 mg total) by mouth every 8 (eight) hours as needed for mild pain. (Patient not taking: Reported on 09/26/2022) 30 tablet 0   sodium chloride (OCEAN) 0.65 % SOLN nasal spray Place 1 spray into both nostrils as needed for congestion. (Patient not taking: Reported on 09/26/2022) 1 mL BOTTLE   No facility-administered medications prior to visit.    Family History  Problem Relation Age of Onset   Hypertension Mother    Miscarriages / [place] Mother    Obesity Mother    Alcohol abuse Father    Alcohol abuse Sister    Hypertension Maternal Grandmother    Alcohol abuse Maternal Grandfather    Cirrhosis Maternal Grandfather     Social History   Socioeconomic History   Marital status: Single    Spouse name: Not on file   Number of children: Not on file   Years of education: Not on file   Highest education level: Not on file  Occupational History   Not on file  Tobacco Use   Smoking status: Former    Types: E-cigarettes    Start date: 2014    Quit date: 2023    Years since quitting: 1.4    Passive exposure: Never   Smokeless tobacco: Never  Vaping Use   Vaping Use: Every day   Substances: Nicotine  Substance and Sexual Activity   Alcohol use: Not Currently   Drug  use: No   Sexual activity: Not Currently    Partners: Male    Birth control/protection: Abstinence, None    Comment: Pt states hse last had intercourse in June 2014.  Other Topics Concern   Not on file  Social History Narrative   Not on file   Social Determinants of Health   Financial Resource Strain: Not on file  Food Insecurity: Not on file  Transportation Needs: Not on file  Physical Activity: Not on file  Stress: Not on file  Social Connections: Not on file  Intimate Partner Violence: Not on file  Objective:  Physical Exam: BP 116/82 (BP Location: Left Arm, Patient Position: Sitting, Cuff Size: Large)   Pulse 78   Temp 97.9 F (36.6 C) (Temporal)   Ht 5' 2.75" (1.594 m)   Wt 181 lb 3.2 oz (82.2 kg)   LMP 09/25/2022   SpO2 97%   BMI 32.35 kg/m     Physical Exam Constitutional:      General: She is not in acute distress.    Appearance: Normal appearance. She is not ill-appearing or toxic-appearing.  HENT:     Head: Normocephalic and atraumatic.     Nose: Nose normal. No congestion.  Eyes:     General: No scleral icterus.    Extraocular Movements: Extraocular movements intact.  Cardiovascular:     Rate and Rhythm: Normal rate and regular rhythm.     Pulses: Normal pulses.     Heart sounds: Normal heart sounds.  Pulmonary:     Effort: Pulmonary effort is normal. No respiratory distress.     Breath sounds: Normal breath sounds.  Abdominal:     General: Abdomen is flat. Bowel sounds are normal.     Palpations: Abdomen is soft.  Musculoskeletal:        General: Normal range of motion.  Lymphadenopathy:     Cervical: No cervical adenopathy.  Skin:    General: Skin is warm and dry.     Findings: No rash.  Neurological:     General: No focal deficit present.     Mental Status: She is alert and oriented to person, place, and time. Mental status is at baseline.      Cranial Nerves: Cranial nerves 2-12 are intact.     Motor: No tremor.     Coordination: Coordination is intact.     Gait: Gait is intact.  Psychiatric:        Attention and Perception: Attention normal.        Mood and Affect: Mood is anxious.        Speech: Speech normal.        Behavior: Behavior normal.        Thought Content: Thought content normal.        Cognition and Memory: Cognition normal.        Judgment: Judgment normal.     No results found.  No results found for this or any previous visit (from the past 2160 hour(s)).      Garner Nash, MD, MS

## 2022-09-27 LAB — CBC WITH DIFFERENTIAL/PLATELET
Absolute Monocytes: 535 cells/uL (ref 200–950)
Basophils Relative: 0.8 %
Eosinophils Absolute: 79 cells/uL (ref 15–500)
Eosinophils Relative: 1.2 %
MCH: 30.1 pg (ref 27.0–33.0)
MCHC: 32.7 g/dL (ref 32.0–36.0)
MCV: 92 fL (ref 80.0–100.0)
Monocytes Relative: 8.1 %
Neutro Abs: 2686 cells/uL (ref 1500–7800)
Platelets: 288 10*3/uL (ref 140–400)
RBC: 3.76 10*6/uL — ABNORMAL LOW (ref 3.80–5.10)
RDW: 12.7 % (ref 11.0–15.0)
WBC: 6.6 10*3/uL (ref 3.8–10.8)

## 2022-09-27 LAB — COMPREHENSIVE METABOLIC PANEL
AG Ratio: 1.4 (calc) (ref 1.0–2.5)
ALT: 40 U/L — ABNORMAL HIGH (ref 6–29)
AST: 24 U/L (ref 10–30)
Albumin: 4.3 g/dL (ref 3.6–5.1)
Alkaline phosphatase (APISO): 74 U/L (ref 31–125)
BUN: 9 mg/dL (ref 7–25)
CO2: 26 mmol/L (ref 20–32)
Calcium: 9.2 mg/dL (ref 8.6–10.2)
Chloride: 105 mmol/L (ref 98–110)
Creat: 0.72 mg/dL (ref 0.50–0.96)
Globulin: 3 g/dL (calc) (ref 1.9–3.7)
Glucose, Bld: 88 mg/dL (ref 65–99)
Potassium: 4.3 mmol/L (ref 3.5–5.3)
Sodium: 138 mmol/L (ref 135–146)
Total Bilirubin: 0.4 mg/dL (ref 0.2–1.2)
Total Protein: 7.3 g/dL (ref 6.1–8.1)

## 2022-09-27 LAB — HEMOGLOBIN A1C
Hgb A1c MFr Bld: 5.6 % of total Hgb (ref <5.7)
Mean Plasma Glucose: 114 mg/dL
eAG (mmol/L): 6.3 mmol/L

## 2022-09-27 LAB — EXTRA SPECIMEN

## 2022-09-27 LAB — LIPID PANEL
Cholesterol: 149 mg/dL (ref <200)
HDL: 52 mg/dL (ref >=50)
LDL Cholesterol (Calc): 88 mg/dL (calc)
Non-HDL Cholesterol (Calc): 97 mg/dL (calc) (ref <130)
Total CHOL/HDL Ratio: 2.9 (calc) (ref <5.0)
Triglycerides: 32 mg/dL (ref <150)

## 2022-09-27 LAB — TIQ-NTM

## 2022-09-27 LAB — B12 AND FOLATE PANEL
Folate: 9.4 ng/mL
Vitamin B-12: 786 pg/mL (ref 200–1100)

## 2022-09-27 LAB — GAMMA GT: GGT: 27 U/L (ref 3–40)

## 2022-09-27 LAB — MAGNESIUM: Magnesium: 2.1 mg/dL (ref 1.5–2.5)

## 2022-09-28 LAB — ACUTE HEP PANEL AND HEP B SURFACE AB
Hep B C IgM: NONREACTIVE
Hepatitis B Surface Ag: NONREACTIVE

## 2022-09-29 NOTE — Addendum Note (Signed)
Addended by: Lindley Magnus L on: 09/29/2022 08:15 AM   Modules accepted: Orders

## 2022-09-30 LAB — VITAMIN D 1,25 DIHYDROXY
Vitamin D 1, 25 (OH)2 Total: 35 pg/mL (ref 18–72)
Vitamin D2 1, 25 (OH)2: <8 pg/mL
Vitamin D3 1, 25 (OH)2: 35 pg/mL

## 2022-09-30 LAB — REFLEX TIQ

## 2022-09-30 LAB — VITAMIN B1: Vitamin B1 (Thiamine): 7 nmol/L — ABNORMAL LOW (ref 8–30)

## 2022-09-30 LAB — EXTRA SPECIMEN

## 2022-09-30 LAB — ACUTE HEP PANEL AND HEP B SURFACE AB
HEPATITIS C ANTIBODY REFILL$(REFL): NONREACTIVE
Hep A IgM: NONREACTIVE

## 2022-09-30 LAB — TIQ-NTM

## 2022-10-27 ENCOUNTER — Ambulatory Visit: Payer: 59 | Admitting: Family Medicine

## 2022-12-30 ENCOUNTER — Encounter: Payer: Self-pay | Admitting: Family Medicine

## 2022-12-30 ENCOUNTER — Ambulatory Visit (INDEPENDENT_AMBULATORY_CARE_PROVIDER_SITE_OTHER): Payer: 59 | Admitting: Family Medicine

## 2022-12-30 VITALS — BP 102/74 | HR 81 | Temp 97.0°F | Wt 176.0 lb

## 2022-12-30 DIAGNOSIS — F10982 Alcohol use, unspecified with alcohol-induced sleep disorder: Secondary | ICD-10-CM

## 2022-12-30 DIAGNOSIS — E519 Thiamine deficiency, unspecified: Secondary | ICD-10-CM | POA: Diagnosis not present

## 2022-12-30 DIAGNOSIS — D6489 Other specified anemias: Secondary | ICD-10-CM | POA: Diagnosis not present

## 2022-12-30 DIAGNOSIS — D538 Other specified nutritional anemias: Secondary | ICD-10-CM

## 2022-12-30 DIAGNOSIS — D508 Other iron deficiency anemias: Secondary | ICD-10-CM

## 2022-12-30 DIAGNOSIS — F410 Panic disorder [episodic paroxysmal anxiety] without agoraphobia: Secondary | ICD-10-CM

## 2022-12-30 DIAGNOSIS — F1020 Alcohol dependence, uncomplicated: Secondary | ICD-10-CM

## 2022-12-30 DIAGNOSIS — Z114 Encounter for screening for human immunodeficiency virus [HIV]: Secondary | ICD-10-CM

## 2022-12-30 DIAGNOSIS — F10980 Alcohol use, unspecified with alcohol-induced anxiety disorder: Secondary | ICD-10-CM

## 2022-12-30 DIAGNOSIS — E5111 Dry beriberi: Secondary | ICD-10-CM | POA: Diagnosis not present

## 2022-12-30 DIAGNOSIS — G621 Alcoholic polyneuropathy: Secondary | ICD-10-CM | POA: Insufficient documentation

## 2022-12-30 HISTORY — DX: Other specified anemias: D64.89

## 2022-12-30 LAB — CBC WITH DIFFERENTIAL/PLATELET
Basophils Absolute: 0 10*3/uL (ref 0.0–0.1)
Basophils Relative: 0.5 % (ref 0.0–3.0)
Eosinophils Absolute: 0 10*3/uL (ref 0.0–0.7)
Eosinophils Relative: 0.4 % (ref 0.0–5.0)
HCT: 36.2 % (ref 36.0–46.0)
Hemoglobin: 11.6 g/dL — ABNORMAL LOW (ref 12.0–15.0)
Lymphocytes Relative: 35.8 % (ref 12.0–46.0)
Lymphs Abs: 1.9 10*3/uL (ref 0.7–4.0)
MCHC: 32 g/dL (ref 30.0–36.0)
MCV: 93.6 fl (ref 78.0–100.0)
Monocytes Absolute: 0.5 10*3/uL (ref 0.1–1.0)
Monocytes Relative: 10 % (ref 3.0–12.0)
Neutro Abs: 2.8 10*3/uL (ref 1.4–7.7)
Neutrophils Relative %: 53.3 % (ref 43.0–77.0)
Platelets: 238 10*3/uL (ref 150.0–400.0)
RBC: 3.87 Mil/uL (ref 3.87–5.11)
RDW: 14 % (ref 11.5–15.5)
WBC: 5.3 10*3/uL (ref 4.0–10.5)

## 2022-12-30 LAB — HEPATIC FUNCTION PANEL
ALT: 23 U/L (ref 0–35)
AST: 23 U/L (ref 0–37)
Albumin: 3.9 g/dL (ref 3.5–5.2)
Alkaline Phosphatase: 66 U/L (ref 39–117)
Bilirubin, Direct: 0.1 mg/dL (ref 0.0–0.3)
Total Bilirubin: 0.3 mg/dL (ref 0.2–1.2)
Total Protein: 6.9 g/dL (ref 6.0–8.3)

## 2022-12-30 MED ORDER — MULTI-VITAMIN/MINERALS PO TABS
1.0000 | ORAL_TABLET | Freq: Every day | ORAL | 3 refills | Status: AC
Start: 2022-12-30 — End: 2023-12-25

## 2022-12-30 MED ORDER — FOLIC ACID 1 MG PO TABS
1.0000 mg | ORAL_TABLET | Freq: Every day | ORAL | 3 refills | Status: AC
Start: 2022-12-30 — End: 2023-12-25

## 2022-12-30 MED ORDER — GABAPENTIN 100 MG PO CAPS
100.0000 mg | ORAL_CAPSULE | Freq: Three times a day (TID) | ORAL | 2 refills | Status: AC
Start: 2022-12-30 — End: 2023-03-30

## 2022-12-30 MED ORDER — NALTREXONE HCL 50 MG PO TABS
50.0000 mg | ORAL_TABLET | Freq: Every day | ORAL | 3 refills | Status: AC
Start: 2022-12-30 — End: 2023-12-25

## 2022-12-30 MED ORDER — THIAMINE HCL 100 MG PO TABS
100.0000 mg | ORAL_TABLET | Freq: Every day | ORAL | 3 refills | Status: AC
Start: 2022-12-30 — End: 2023-12-25

## 2022-12-30 NOTE — Assessment & Plan Note (Signed)
Persistent, exacerbated recently.  Plan: Trial gabapentin as above. Consider propranolol for situational anxiety if initial medication adjustments are ineffective. Referral for therapy focused on anxiety management.

## 2022-12-30 NOTE — Assessment & Plan Note (Signed)
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  UA negative for gross protein, less likely to be Monoclonal Gammopathy   Continue work on alcohol cessation Supplement thiamine and folate Referral to neurology

## 2022-12-30 NOTE — Patient Instructions (Addendum)
Restart naltrexone at a maintenance dose of 50mg  daily. Begin gabapentin starting at 100mg  at night to help with insomnia, anxiety, and alcohol cravings. Take a daily multivitamin with added thiamine and folic acid to help with potential deficiencies. Continue to avoid alcohol and monitor any symptoms of tingling or neuropathy; consider taking note of any changes. Follow up with therapy referral and consider local AA groups to support comprehensive treatment for alcohol use and anxiety.  Here are some treatment centers in [place] and [place], [place], that specialize in alcohol abuse:  [facility].  Located at [address], this center has been serving the community since 1996. They offer a wide range of services, including alcohol and drug treatment, individual therapy, family therapy, and drug detoxification. They are known for their personalized approach and are equipped with a psychiatrist and a nurse practitioner to manage medications.  [facility]  This facility provides comprehensive treatment programs, including inpatient and outpatient services, specifically designed for alcohol addiction. They accept various insurance plans and offer specialized programs tailored to individual needs.  [facility]  A nonprofit organization located at [address] [facility] focuses on prevention and treatment for those affected by substance abuse. They offer personalized treatment plans, group counseling, and specialized medical care, aiming to reduce the harmful effects of addiction in the community.  [facility] - This center provides comprehensive substance use treatment, including individual and group counseling, detoxification, and relapse prevention. They also offer support for co-occurring mental health issues, which is crucial for individuals who might be dealing with more complex cases.  [facility] - Located in  nearby [place], [facility] offers various outpatient services, including substance abuse treatment and mental health services. They accept a variety of payment options, including Medicaid and self-pay, making them accessible to many individuals.  [facility] - This facility offers both inpatient and outpatient services, focusing on long-term recovery. They provide personalized treatment plans and have programs designed for those struggling with both substance abuse and mental health disorders.

## 2022-12-30 NOTE — Assessment & Plan Note (Signed)
Previously noted; likely associated with thiamine deficiency and alcohol use. Plan: Recheck thiamine, CBC and iron studies. Ensure adequate multivitamin intake, including thiamine.

## 2022-12-30 NOTE — Progress Notes (Signed)
Assessment/Plan:   Problem List Items Addressed This Visit       Nervous and Auditory   Alcoholic peripheral neuropathy (HCC)    Neuropathy likely associated with alcohol abuse and thiamine deficiency; Negative for diabetes.  UA negative for gross protein, less likely to be Monoclonal Gammopathy   Continue work on alcohol cessation Supplement thiamine and folate Referral to neurology      Relevant Medications   gabapentin (NEURONTIN) 100 MG capsule   Other Relevant Orders   Ambulatory referral to Neurology   Thiamine deficiency neuropathy   Relevant Medications   gabapentin (NEURONTIN) 100 MG capsule   Other Relevant Orders   Ambulatory referral to Neurology   Alcohol-induced anxiety disorder (HCC)    Persistent, exacerbated recently.  Plan: Trial gabapentin as above. Consider propranolol for situational anxiety if initial medication adjustments are ineffective. Referral for therapy focused on anxiety management.      Relevant Medications   gabapentin (NEURONTIN) 100 MG capsule     Other   Alcohol use disorder, moderate, dependence (HCC)    Naltrexone was initially effective; alcohol use escalated since discontinuation.  Plan: Resume naltrexone at 50mg  daily. Add gabapentin 100mg  prn insomnia, anxiety, alcohol craving Referral to therapy for alcohol use, anxiety, and insomnia management. Discuss AA and other support group involvement. Resources provided Monitor symptoms for withdrawal and relapse Re-evaluate in a 3 months.      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 Plan: Recheck thiamine, CBC and iron studies. Ensure  adequate multivitamin intake, including thiamine.      Relevant Medications   folic acid (FOLVITE) 1 MG tablet   Anemia due to acquired thiamine deficiency   Relevant Medications   folic acid (FOLVITE) 1 MG tablet   Other Relevant Orders   CBC w/Diff   Iron, TIBC and Ferritin Panel   Thiamine deficiency - Primary   Relevant Medications   thiamine (VITAMIN B1) 100 MG tablet   folic acid (FOLVITE) 1 MG tablet   Multiple Vitamins-Minerals (MULTIVITAMIN WITH MINERALS) tablet   Alcohol-induced insomnia (HCC)    Plan: Continue current sleep hygiene measures. Referral for CBT-I Prescribe gabapentin, considering potential benefit for insomnia. Follow-up on the effectiveness of gabapentin for sleep.      Relevant Medications   gabapentin (NEURONTIN) 100 MG capsule   Other Visit Diagnoses     Screening for HIV (human immunodeficiency virus)       Relevant Orders   HIV antibody (with reflex)       There are no discontinued medications.  Return in about 3 months (around 03/31/2023), or or sooner if symptoms worsen or fail to improve, for alcohol use and anxiety.    Subjective:   Encounter date: 12/30/2022  Victoria Bryan is a 27 y.o. 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   Chief Complaint: Follow-up on naltrexone use and increased alcohol consumption.  History of Present Illness:  Alcohol Use: Patient reports increased alcohol consumption since running out of naltrexone in early August. Initially manageable in social  settings, alcohol consumption escalated post-medication discontinuation, with substantial intake over the weekend (one bottle of wine and a couple of shots). Patient did not consume alcohol from Monday to Friday during naltrexone use and has not had any alcohol since the recent weekend. No adverse side effects were reported from naltrexone.  Anxiety: Patient has experienced persistent anxiety, worsening slightly and not alleviated by over-the-counter Benadryl, which she uses regularly but now finds ineffective. No previous formal treatment for anxiety, only self-managed with alcohol or Benadryl. Anxiety is notably intense in social situations or potentially stressful activities like driving on highways.  Insomnia: Persistent insomnia issues since teenage years, managed nightly with Benadryl or melatonin (10 mg), which she feels are becoming less effective.     09/26/2022   10:23 AM  Depression screen PHQ 2/9  Decreased Interest 0  Down, Depressed, Hopeless 1  PHQ - 2 Score 1  Altered sleeping 1  Tired, decreased energy 1  Change in appetite 2  Feeling bad or failure about yourself  0  Trouble concentrating 0  Moving slowly or fidgety/restless 0  Suicidal thoughts 0  PHQ-9 Score 5  Difficult doing work/chores Somewhat difficult      09/26/2022   10:23 AM  GAD 7 : Generalized Anxiety Score  Nervous, Anxious, on Edge 1  Control/stop worrying 0  Worry too much - different things 0  Trouble relaxing 0  Restless 0  Easily annoyed or irritable 1  Afraid - awful might happen 0  Total GAD 7 Score 2  Anxiety Difficulty Somewhat difficult      Review of Systems  Constitutional:  Negative for chills, diaphoresis, fever, malaise/fatigue and weight loss.  HENT:  Negative for congestion, ear discharge, ear pain and hearing loss.   Eyes:  Negative for blurred vision, double vision, photophobia, pain, discharge and redness.  Respiratory:  Negative for cough, sputum production, shortness of  breath and wheezing.   Cardiovascular:  Negative for chest pain and palpitations.  Gastrointestinal:  Negative for abdominal pain, blood in stool, constipation, diarrhea, heartburn, melena, nausea and vomiting.  Genitourinary:  Negative for dysuria, flank pain, frequency, hematuria and urgency.  Musculoskeletal:  Negative for myalgias.  Skin:  Negative for itching and rash.  Neurological:  Positive for tingling (numbness/tingling in toes). Negative for dizziness, tremors, speech change, seizures, loss of consciousness, weakness and headaches.  Endo/Heme/Allergies:  Negative for polydipsia.  Psychiatric/Behavioral:  Positive for substance abuse (alcohol, no other substances). Negative for depression, hallucinations, memory loss and suicidal ideas. The patient is nervous/anxious and has insomnia.   All other systems reviewed and are negative.   No past surgical history on file.  Outpatient Medications Prior to Visit  Medication Sig Dispense Refill   butalbital-acetaminophen-caffeine (FIORICET) 50-325-40 MG tablet Take by mouth 2 (two) times daily as needed for headache.     No facility-administered medications prior to visit.    Family History  Problem Relation Age of Onset   Hypertension Mother    Miscarriages / [place] Mother    Obesity Mother    Alcohol abuse Father    Alcohol abuse Sister    Hypertension Maternal Grandmother    Alcohol abuse Maternal Grandfather    Cirrhosis Maternal Grandfather     Social History   Socioeconomic History  Marital status: Single    Spouse name: Not on file   Number of children: Not on file   Years of education: Not on file   Highest education level: Associate degree: occupational, Scientist, product/process development, or vocational program  Occupational History   Not on file  Tobacco Use   Smoking status: Former    Types: E-cigarettes    Start date: 2014    Quit date: 2023    Years since quitting: 1.6    Passive exposure: Never   Smokeless tobacco: Never   Vaping Use   Vaping status: Every Day   Substances: Nicotine  Substance and Sexual Activity   Alcohol use: Not Currently   Drug use: No   Sexual activity: Not Currently    Partners: Male    Birth control/protection: Abstinence, None    Comment: Pt states hse last had intercourse in June 2014.  Other Topics Concern   Not on file  Social History Narrative   Not on file   Social Determinants of Health   Financial Resource Strain: Low Risk  (12/29/2022)   Overall Financial Resource Strain (CARDIA)    Difficulty of Paying Living Expenses: Not hard at all  Food Insecurity: No Food Insecurity (12/29/2022)   Hunger Vital Sign    Worried About Running Out of Food in the Last Year: Never true    Ran Out of Food in the Last Year: Never true  Transportation Needs: No Transportation Needs (12/29/2022)   PRAPARE - Administrator, Civil Service (Medical): No    Lack of Transportation (Non-Medical): No  Physical Activity: Insufficiently Active (12/29/2022)   Exercise Vital Sign    Days of Exercise per Week: 2 days    Minutes of Exercise per Session: 60 min  Stress: Stress Concern Present (12/29/2022)   [employer] of Occupational Health - Occupational Stress Questionnaire    Feeling of Stress : To some extent  Social Connections: Moderately Isolated (12/29/2022)   Social Connection and Isolation Panel [NHANES]    Frequency of Communication with Friends and Family: More than three times a week    Frequency of Social Gatherings with Friends and Family: More than three times a week    Attends Religious Services: Never    Database administrator or Organizations: Yes    Attends Banker Meetings: 1 to 4 times per year    Marital Status: Never married  Catering manager Violence: Not on file                                                                                                  Objective:  Physical Exam: BP 102/74   Pulse 81   Temp (!) 97 F (36.1 C)  (Temporal)   Wt 176 lb (79.8 kg)   SpO2 98%   BMI 31.43 kg/m     Physical Exam Constitutional:      General: She is not in acute distress.    Appearance: Normal appearance. She is not ill-appearing or toxic-appearing.  HENT:     Head: Normocephalic and atraumatic.     Nose:  Nose normal. No congestion.  Eyes:     General: No scleral icterus.    Extraocular Movements: Extraocular movements intact.  Cardiovascular:     Rate and Rhythm: Normal rate and regular rhythm.     Pulses: Normal pulses.     Heart sounds: Normal heart sounds.  Pulmonary:     Effort: Pulmonary effort is normal. No respiratory distress.     Breath sounds: Normal breath sounds.  Abdominal:     General: Abdomen is flat. Bowel sounds are normal.     Palpations: Abdomen is soft.  Musculoskeletal:        General: Normal range of motion.  Lymphadenopathy:     Cervical: No cervical adenopathy.  Skin:    General: Skin is warm and dry.     Findings: No rash.  Neurological:     General: No focal deficit present.     Mental Status: She is alert and oriented to person, place, and time. Mental status is at baseline.  Psychiatric:        Mood and Affect: Mood is anxious.        Behavior: Behavior normal.        Thought Content: Thought content normal.        Judgment: Judgment normal.     No results found.  No results found for this or any previous visit (from the past 2160 hour(s)).      Garner Nash, MD, MS

## 2022-12-30 NOTE — Assessment & Plan Note (Signed)
Plan: Continue current sleep hygiene measures. Referral for CBT-I Prescribe gabapentin, considering potential benefit for insomnia. Follow-up on the effectiveness of gabapentin for sleep.

## 2022-12-30 NOTE — Assessment & Plan Note (Signed)
Naltrexone was initially effective; alcohol use escalated since discontinuation.  Plan: Resume naltrexone at 50mg  daily. Add gabapentin 100mg  prn insomnia, anxiety, alcohol craving Referral to therapy for alcohol use, anxiety, and insomnia management. Discuss AA and other support group involvement. Resources provided Monitor symptoms for withdrawal and relapse Re-evaluate in a 3 months.

## 2022-12-31 ENCOUNTER — Telehealth: Payer: Self-pay | Admitting: Family Medicine

## 2022-12-31 LAB — IRON,TIBC AND FERRITIN PANEL
%SAT: 10 % — ABNORMAL LOW (ref 16–45)
Ferritin: 11 ng/mL — ABNORMAL LOW (ref 16–154)
Iron: 38 ug/dL — ABNORMAL LOW (ref 40–190)
TIBC: 374 ug/dL (ref 250–450)

## 2022-12-31 LAB — HIV ANTIBODY (ROUTINE TESTING W REFLEX): HIV 1&2 Ab, 4th Generation: NONREACTIVE

## 2023-01-01 MED ORDER — SLOW IRON 160 (50 FE) MG PO TBCR: 1.0000 | EXTENDED_RELEASE_TABLET | Freq: Every day | ORAL | 3 refills | Status: AC

## 2023-01-01 NOTE — Addendum Note (Signed)
Addended by: Fanny Bien B on: 01/01/2023 11:52 AM   Modules accepted: Orders

## 2023-01-08 ENCOUNTER — Encounter: Payer: Self-pay | Admitting: Neurology

## 2023-01-12 NOTE — Telephone Encounter (Signed)
error 

## 2023-01-30 NOTE — Progress Notes (Deleted)
Initial neurology clinic note  Reason for Evaluation: Consultation requested by Garnette Gunner, MD for an opinion regarding neuropathy. My final recommendations will be communicated back to the requesting physician by way of shared medical record or letter to requesting physician via [place] mail.  HPI: This is [name], a 27 y.o. ***-handed female with a medical history of migraine, tobacco use disorder, EtOH abuse, thiamine deficiency, anxiety who presents to neurology clinic with the chief complaint of ***. The patient is accompanied by ***.  *** ?EtOH induced neuropathy Thiamine deficiency Was on naltrexone On B1 100 mg and folate 1 mg daily supplementation On gabapentin 100 mg TID  The patient has not*** had similar episodes of symptoms in the past. ***  Muscle bulk loss? *** Muscle pain? ***  Cramps/Twitching? *** Suggestion of myotonia/difficulty relaxing after contraction? ***  Fatigable weakness?*** Does strength improve after brief exercise?***  Able to brush hair/teeth without difficulty? *** Able to button shirts/use zips? *** Clumsiness/dropping grasped objects?*** Can you arise from squatted position easily? *** Able to get out of chair without using arms? *** Able to walk up steps easily? *** Use an assistive device to walk? *** Significant imbalance with walking? *** Falls?*** Any change in urine color, especially after exertion/physical activity? ***  The patient denies*** symptoms suggestive of oculobulbar weakness including diplopia, ptosis, dysphagia, poor saliva control, dysarthria/dysphonia, impaired mastication, facial weakness/droop.  There are no*** neuromuscular respiratory weakness symptoms, particularly orthopnea>dyspnea.   Pseudobulbar affect is absent***.  The patient does not*** report symptoms referable to autonomic dysfunction including impaired sweating, heat or cold intolerance, excessive mucosal dryness, gastroparetic early satiety,  postprandial abdominal bloating, constipation, bowel or bladder dyscontrol, erectile dysfunction*** or syncope/presyncope/orthostatic intolerance.  There are no*** complaints relating to other symptoms of small fiber modalities including paresthesia/pain.  The patient has not *** noticed any recent skin rashes nor does he*** report any constitutional symptoms like fever, night sweats, anorexia or unintentional weight loss.  EtOH use: ***  Restrictive diet? *** Family history of neuropathy/myopathy/NM disease?***  Previous labs, electrodiagnostics, and neuroimaging are summarized below, but pertinent findings include***  Any biopsy done? *** Current medications being tried for the patient's symptoms include ***  Prior medications that have been tried: ***   MEDICATIONS:  Outpatient Encounter Medications as of 02/13/2023  Medication Sig   butalbital-acetaminophen-caffeine (FIORICET) 50-325-40 MG tablet Take by mouth 2 (two) times daily as needed for headache.   ferrous sulfate (SLOW IRON) 160 (50 Fe) MG TBCR SR tablet Take 1 tablet (160 mg total) by mouth daily.   folic acid (FOLVITE) 1 MG tablet Take 1 tablet (1 mg total) by mouth daily.   gabapentin (NEURONTIN) 100 MG capsule Take 1 capsule (100 mg total) by mouth 3 (three) times daily.   Multiple Vitamins-Minerals (MULTIVITAMIN WITH MINERALS) tablet Take 1 tablet by mouth daily.   naltrexone (DEPADE) 50 MG tablet Take 1 tablet (50 mg total) by mouth daily.   thiamine (VITAMIN B1) 100 MG tablet Take 1 tablet (100 mg total) by mouth daily.   No facility-administered encounter medications on file as of 02/13/2023.    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  ALLERGIES: No Known Allergies  FAMILY HISTORY: Family History  Problem Relation Age of Onset   Hypertension Mother     Miscarriages / [place] Mother  Obesity Mother    Alcohol abuse Father    Alcohol abuse Sister    Hypertension Maternal Grandmother    Alcohol abuse Maternal Grandfather    Cirrhosis Maternal Grandfather     SOCIAL HISTORY: Social History   Tobacco Use   Smoking status: Former    Types: E-cigarettes    Start date: 2014    Quit date: 2023    Years since quitting: 1.7    Passive exposure: Never   Smokeless tobacco: Never  Vaping Use   Vaping status: Every Day   Substances: Nicotine  Substance Use Topics   Alcohol use: Not Currently   Drug use: No   Social History   Social History Narrative   Not on file     OBJECTIVE: PHYSICAL EXAM: There were no vitals taken for this visit.  General:*** General appearance: Awake and alert. No distress. Cooperative with exam.  Skin: No obvious rash or jaundice. HEENT: Atraumatic. Anicteric. Lungs: Non-labored breathing on room air  Heart: Regular Abdomen: Soft, non tender. Extremities: No edema. No obvious deformity.  Musculoskeletal: No obvious joint swelling. Psych: Affect appropriate.  Neurological: Mental Status: Alert. Speech fluent. No pseudobulbar affect Cranial Nerves: CNII: No RAPD. Visual fields grossly intact. CNIII, IV, VI: PERRL. No nystagmus. EOMI. CN V: Facial sensation intact bilaterally to fine touch. [place] clench strong. Jaw jerk***. CN VII: Facial muscles symmetric and strong. No ptosis at rest or after sustained upgaze***. CN VIII: Hearing grossly intact bilaterally. CN IX: No hypophonia. CN X: Palate elevates symmetrically. CN XI: Full strength shoulder shrug bilaterally. CN XII: Tongue protrusion full and midline. No atrophy or fasciculations. No significant dysarthria*** Motor: Tone is ***. *** fasciculations in *** extremities. *** atrophy. No grip or percussive myotonia.***  Individual muscle group testing (MRC grade out of 5):  Movement     Neck flexion ***    Neck extension ***      Right Left   Shoulder abduction *** ***   Shoulder adduction *** ***   Shoulder ext rotation *** ***   Shoulder int rotation *** ***   Elbow flexion *** ***   Elbow extension *** ***   Wrist extension *** ***   Wrist flexion *** ***   Finger abduction - FDI *** ***   Finger abduction - ADM *** ***   Finger extension *** ***   Finger distal flexion - 2/3 *** ***   Finger distal flexion - 4/5 *** ***   Thumb flexion - FPL *** ***   Thumb abduction - APB *** ***    Hip flexion *** ***   Hip extension *** ***   Hip adduction *** ***   Hip abduction *** ***   Knee extension *** ***   Knee flexion *** ***   Dorsiflexion *** ***   Plantarflexion *** ***   Inversion *** ***   Eversion *** ***   Great toe extension *** ***   Great toe flexion *** ***     Reflexes:  Right Left   Bicep *** ***   Tricep *** ***   BrRad *** ***   Knee *** ***   Ankle *** ***    Pathological Reflexes: Babinski: *** response bilaterally*** Hoffman: *** Troemner: *** Pectoral: *** Palmomental: *** Facial: *** Midline tap: *** Sensation: Pinprick: *** Vibration: *** Temperature: *** Proprioception: *** Coordination: Intact finger-to- nose-finger bilaterally. Romberg negative.*** Gait: Able to rise from chair with arms crossed unassisted. Normal, narrow-based gait. Able to tandem walk. Able to walk on  toes and heels.***  Lab and Test Review: Internal labs: 12/30/22: HIV nonreactive Ferritin 11 CBC w/ diff significant for Hb of 11.6, platelets wnl Hepatic function panel wnl  09/26/22: HbA1c: 5.6 B12: 786 Folate wnl B1: low at 7 Vit D wnl  External labs: ***  Imaging: ***  ASSESSMENT: Victoria Bryan is a 27 y.o. female who presents for evaluation of ***. *** has a relevant medical history of ***. *** neurological examination is pertinent for ***. Available diagnostic data is significant for ***. This constellation of symptoms and objective data would most likely localize to  ***. ***  PLAN: -Blood work: ***IFE ***  -Return to clinic ***  The impression above as well as the plan as outlined below were extensively discussed with the patient (in the company of ***) who voiced understanding. All questions were answered to their satisfaction.  The patient was counseled on pertinent fall precautions per the printed material provided today, and as noted under the "Patient Instructions" section below.***  When available, results of the above investigations and possible further recommendations will be communicated to the patient via telephone/MyChart. Patient to call office if not contacted after expected testing turnaround time.   Total time spent reviewing records, interview, history/exam, documentation, and coordination of care on day of encounter:  *** min   Thank you for allowing me to participate in patient's care.  If I can answer any additional questions, I would be pleased to do so.  Jacquelyne Balint, MD   CC: Garnette Gunner, MD [address] [place] [address]  CC: Referring provider: Garnette Gunner, MD [address] [place],  [place] [address]

## 2023-02-13 ENCOUNTER — Ambulatory Visit: Payer: 59 | Admitting: Neurology

## 2023-10-15 ENCOUNTER — Ambulatory Visit: Payer: Self-pay | Admitting: Family Medicine

## 2023-11-10 ENCOUNTER — Other Ambulatory Visit: Payer: Self-pay

## 2023-11-10 ENCOUNTER — Ambulatory Visit
Admission: EM | Admit: 2023-11-10 | Discharge: 2023-11-10 | Disposition: A | Discharge status: Home / Self Care | Attending: Physician Assistant | Admitting: Physician Assistant

## 2023-11-10 ENCOUNTER — Ambulatory Visit (INDEPENDENT_AMBULATORY_CARE_PROVIDER_SITE_OTHER): Payer: Self-pay | Admitting: Radiology

## 2023-11-10 DIAGNOSIS — R1032 Left lower quadrant pain: Secondary | ICD-10-CM | POA: Diagnosis not present

## 2023-11-10 LAB — POCT URINALYSIS DIP (MANUAL ENTRY)
Bilirubin, UA: NEGATIVE
Glucose, UA: NEGATIVE mg/dL
Ketones, POC UA: NEGATIVE mg/dL
Leukocytes, UA: NEGATIVE
Nitrite, UA: NEGATIVE
Protein Ur, POC: NEGATIVE mg/dL
Spec Grav, UA: 1.025 (ref 1.010–1.025)
Urobilinogen, UA: 0.2 U/dL
pH, UA: 6 (ref 5.0–8.0)

## 2023-11-10 LAB — POCT URINE PREGNANCY: Preg Test, Ur: NEGATIVE

## 2023-11-10 NOTE — ED Triage Notes (Addendum)
 Pt presents with complaints of left lower abdominal pain that began this morning, 7/22. States her pain is about a 5/10. Describes as intermittent & sharp, worsens with bending over. Pain is beginning to radiate around to posterior left back. Pt is tearful in triage.

## 2023-11-10 NOTE — Discharge Instructions (Signed)
 You were seen today for concerns of severe abdominal pain in your left lower quadrant  Your xray was negative for evidence of a radiopaque kidney stone but this is not a definitive rule out. I do recommend going to the ED for advanced imaging given the amount of pain you are having as this could represent a medical emergency. The ED will also be able to provide you with some pain control while they are evaluating you. If you start having more severe symptoms or feel like you are not able to make the trip please call 911 for assistance.

## 2023-11-10 NOTE — ED Provider Notes (Signed)
 [facility]    CSN: [id number] Arrival date & time: 11/10/23  1126      History   Chief Complaint Chief Complaint  Patient presents with   Abdominal Pain    HPI Victoria Bryan is a 28 y.o. female.   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 No pertinent surgical history.  OB History   No obstetric history on file.      Home Medications    Prior to Admission medications   Medication Sig Start Date End Date Taking? Authorizing Provider  butalbital -acetaminophen -caffeine  (FIORICET) 50-325-40 MG tablet Take by mouth 2 (two) times daily as needed for headache.    [provider]  ferrous sulfate (SLOW IRON ) 160 (50 Fe) MG TBCR SR tablet Take 1 tablet (160 mg total) by mouth daily. 01/01/23 12/27/23  Sebastian Beverley NOVAK, MD  folic acid  (FOLVITE ) 1 MG tablet Take 1 tablet (1 mg total) by mouth daily. 12/30/22 12/25/23  Sebastian Beverley NOVAK, MD  gabapentin  (NEURONTIN ) 100 MG capsule Take 1 capsule (100 mg total) by mouth 3 (three) times daily. 12/30/22 03/30/23  Sebastian Beverley NOVAK, MD  Multiple Vitamins-Minerals (MULTIVITAMIN WITH MINERALS) tablet Take 1 tablet by mouth daily. 12/30/22 12/25/23  Sebastian Beverley NOVAK, MD  naltrexone  (DEPADE) 50 MG tablet Take 1 tablet (50 mg total) by mouth daily. 12/30/22 12/25/23  Sebastian Beverley NOVAK, MD  thiamine  (VITAMIN B1) 100 MG tablet Take 1 tablet (100 mg total) by mouth daily. 12/30/22 12/25/23  Sebastian Beverley NOVAK, MD    Family History Family History  Problem Relation Age of Onset   Hypertension Mother    Miscarriages / [place] Mother    Obesity Mother    Alcohol abuse Father    Alcohol abuse Sister    Hypertension Maternal Grandmother    Alcohol abuse Maternal Grandfather    Cirrhosis Maternal Grandfather     Social History Social History   Tobacco Use   Smoking status: Former    Types: E-cigarettes    Start date: 2014    Quit date: 2023    Years since quitting: 2.5    Passive exposure: Never   Smokeless tobacco: Never  Vaping Use   Vaping status: Every Day   Substances: Nicotine  Substance Use Topics   Alcohol use: Not Currently   Drug use: No     Allergies   Patient has no known allergies.   Review of Systems Review of Systems  Constitutional:  Negative for chills and fever.  Gastrointestinal:  Positive for abdominal pain. Negative for blood in stool, constipation, diarrhea, nausea and vomiting.  Genitourinary:  Positive for flank pain. Negative for dysuria, hematuria, menstrual problem, vaginal bleeding, vaginal discharge and vaginal pain.     Physical Exam Triage Vital Signs ED Triage Vitals  Encounter Vitals Group     BP 11/10/23 1145 120/84     Girls Systolic BP Percentile --      Girls Diastolic BP Percentile --      Boys Systolic BP Percentile --      Boys Diastolic BP Percentile --      Pulse Rate 11/10/23 1145 77     Resp 11/10/23 1145 16     Temp  11/10/23 1145 98.3 F (36.8 C)     Temp Source 11/10/23 1145 Oral     SpO2 11/10/23 1145 96 %     Weight 11/10/23 1145 187 lb (84.8 kg)     Height 11/10/23 1145 5' 4 (1.626 m)     Head Circumference --      Peak Flow --      Pain Score 11/10/23 1155 5     Pain Loc --      Pain Education --      Exclude from Growth Chart --    No data found.  Updated Vital Signs BP 120/84 (BP Location: Right Arm)   Pulse 77   Temp 98.3 F (36.8 C) (Oral)   Resp 16   Ht 5' 4 (1.626 m)   Wt 187 lb (84.8 kg)   LMP 11/08/2023 (Exact Date)   SpO2 96%   BMI 32.10 kg/m   Visual Acuity Right Eye Distance:   Left Eye Distance:   Bilateral Distance:    Right Eye Near:   Left Eye Near:    Bilateral Near:     Physical Exam Vitals reviewed.  Constitutional:      General: She is awake. She is in acute distress.     Appearance: She is well-developed and well-groomed.     Comments: Pt is seated in exam chair and appears visibly uncomfortable.   HENT:     Head: Normocephalic and atraumatic.  Eyes:     General: Lids are normal. Gaze aligned appropriately.     Extraocular Movements: Extraocular movements intact.     Conjunctiva/sclera: Conjunctivae normal.  Cardiovascular:     Rate and Rhythm: Normal rate and regular rhythm.     Heart sounds: Normal heart sounds. No murmur heard.    No friction rub. No gallop.  Pulmonary:     Effort: Pulmonary effort is normal.     Breath sounds: Normal breath sounds. No decreased air movement. No decreased breath sounds, wheezing, rhonchi or rales.  Abdominal:     General: Abdomen is flat. Bowel sounds are normal. There is no distension.     Palpations: Abdomen is soft.     Tenderness: There is abdominal tenderness in the left upper quadrant and left lower quadrant. There is left CVA tenderness and guarding. Negative signs include Murphy's sign and McBurney's sign.     Comments: No obvious evidence of a hernia present along LLQ, inguinal crease, ventral or  umbilical areas.  She does have exquisite tenderness over the LLQ with palpation.  She reports more severe pain with laying down. She states sitting upright and sometimes bending forward seem to make the pain better.   Musculoskeletal:     Cervical back: Normal range of motion.  Neurological:     Mental Status: She is alert and oriented to person, place, and time.  Psychiatric:        Attention and Perception: Attention and perception normal.        Mood and Affect: Mood and affect normal.        Speech: Speech normal.        Behavior: Behavior normal. Behavior is cooperative.      UC Treatments / Results  Labs (all labs ordered are listed, but only abnormal results are displayed) Labs Reviewed  POCT URINALYSIS DIP (MANUAL ENTRY) - Abnormal; Notable for the following components:      Result Value   Color, UA straw (*)    Clarity, UA cloudy (*)    Blood, UA large (*)    All other components within normal limits  POCT URINE PREGNANCY    EKG   Radiology DG Abdomen 1 View Result Date: 11/10/2023 CLINICAL DATA:  Left lower quadrant abdominal pain. EXAM: ABDOMEN - 1 VIEW COMPARISON:  None Available. FINDINGS: The bowel gas pattern is normal. No radio-opaque calculi or other significant radiographic abnormality are seen. IMPRESSION: Negative. Electronically Signed   By: Vanetta Chou M.D.   On: 11/10/2023 13:13    Procedures Procedures (including critical care time)  Medications Ordered in UC Medications - No data to display  Initial Impression / Assessment and Plan / UC Course  I have reviewed the triage vital signs and the nursing notes.  Pertinent labs & imaging results that were available during my care of the patient were reviewed by me and considered in my medical decision making (see chart for details).     She declines Tylenol  to assist with pain management here in UC. Do no recommend NSAID admin until etiology of pain has been discovered as this can increased  bleeding risk Differential diagnosis at this time: nephrolithiasis, ovarian cyst, ovarian torsion, mesenteric adenitis, diverticulitis  Final Clinical Impressions(s) / UC Diagnoses   Final diagnoses:  Left lower quadrant abdominal pain   Patient presents today with concerns for left lower quadrant pain that has been ongoing since this morning.  She reports that it is radiating into the left flank and denies dysuria, increased urinary frequency, nausea, vomiting, diarrhea, constipation, blood in the stool.  Physical exam is notable for exquisite tenderness in the left lower quadrant but no obvious signs of hernia, rigidity, tenesmus.  Urine dip was positive for blood but this may be contamination as she is currently on her menses.  KUB was negative for radiopaque nephrolithiasis.  Given severity of pain I recommend evaluation in the emergency room with advanced imaging to rule out potential acute abdomen.  Patient declines pain control offered here in urgent care.  She declines EMS transport and states that she will go to the ED via private vehicle.    Discharge Instructions      You were seen today for concerns of severe abdominal pain in your left lower quadrant  Your xray was negative for evidence of a radiopaque kidney stone but this is not a definitive rule out. I do recommend going to the ED for advanced imaging given the amount of pain you are having as this could represent a medical emergency. The ED will also be able to provide you with some pain control while they are evaluating you. If you start having more severe symptoms or feel like you are not able to make the trip please call 911 for assistance.  ED Prescriptions   None    PDMP not reviewed this encounter.   Kaydn Kumpf, Rocky BRAVO, PA-C 11/10/23 1330

## 2023-11-10 NOTE — ED Notes (Signed)
Pt states she is currently on her menstrual cycle.

## 2023-11-10 NOTE — ED Notes (Signed)
 Patient is being discharged from the Urgent Care and sent to the Emergency Department via private vehicle . Per Rocky Mecum PA, patient is in need of higher level of care due to left lower abdominal pain, radiating to posterior left back. Patient is aware and verbalizes understanding of plan of care.   Vitals:   11/10/23 1145  BP: 120/84  Pulse: 77  Resp: 16  Temp: 98.3 F (36.8 C)  SpO2: 96%

## 2023-11-20 ENCOUNTER — Encounter: Payer: Self-pay | Admitting: Family Medicine

## 2024-01-12 ENCOUNTER — Ambulatory Visit: Admitting: Family Medicine

## 2024-01-26 ENCOUNTER — Encounter: Payer: Self-pay | Admitting: Family Medicine
